# Patient Record
Sex: Female | Born: 1960
Health system: Southern US, Community
[De-identification: ages and names within clinical notes are randomized; demographics above are authoritative.]

## PROBLEM LIST (undated history)

## (undated) DIAGNOSIS — E049 Nontoxic goiter, unspecified: Secondary | ICD-10-CM

## (undated) DIAGNOSIS — M1990 Unspecified osteoarthritis, unspecified site: Secondary | ICD-10-CM

## (undated) DIAGNOSIS — J449 Chronic obstructive pulmonary disease, unspecified: Secondary | ICD-10-CM

## (undated) DIAGNOSIS — G43909 Migraine, unspecified, not intractable, without status migrainosus: Secondary | ICD-10-CM

## (undated) HISTORY — PX: TUMOR REMOVAL: SHX12

---

## 1998-11-05 ENCOUNTER — Other Ambulatory Visit: Admission: RE | Admit: 1998-11-05 | Discharge: 1998-11-05 | Payer: Self-pay | Admitting: Obstetrics and Gynecology

## 1999-06-02 ENCOUNTER — Inpatient Hospital Stay (HOSPITAL_COMMUNITY): Admission: AD | Admit: 1999-06-02 | Discharge: 1999-06-05 | Payer: Self-pay | Admitting: Obstetrics and Gynecology

## 2001-04-05 ENCOUNTER — Inpatient Hospital Stay (HOSPITAL_COMMUNITY): Admission: RE | Admit: 2001-04-05 | Discharge: 2001-04-07 | Payer: Self-pay | Admitting: Obstetrics and Gynecology

## 2002-06-17 ENCOUNTER — Encounter: Payer: Self-pay | Admitting: Family Medicine

## 2002-06-17 ENCOUNTER — Encounter: Admission: RE | Admit: 2002-06-17 | Discharge: 2002-06-17 | Payer: Self-pay | Admitting: Family Medicine

## 2004-10-28 ENCOUNTER — Encounter: Admission: RE | Admit: 2004-10-28 | Discharge: 2004-10-28 | Payer: Self-pay | Admitting: Family Medicine

## 2005-02-15 ENCOUNTER — Other Ambulatory Visit: Admission: RE | Admit: 2005-02-15 | Discharge: 2005-02-15 | Payer: Self-pay | Admitting: Obstetrics and Gynecology

## 2005-07-05 ENCOUNTER — Encounter: Admission: RE | Admit: 2005-07-05 | Discharge: 2005-07-05 | Payer: Self-pay | Admitting: Family Medicine

## 2010-10-29 ENCOUNTER — Encounter: Payer: Self-pay | Admitting: Family Medicine

## 2010-10-30 ENCOUNTER — Encounter: Payer: Self-pay | Admitting: Family Medicine

## 2014-03-29 ENCOUNTER — Emergency Department (HOSPITAL_COMMUNITY)
Admission: EM | Admit: 2014-03-29 | Discharge: 2014-03-30 | Disposition: A | Discharge status: Other Acute Inpatient Hospital | Payer: Self-pay | Attending: Emergency Medicine | Admitting: Emergency Medicine

## 2014-03-29 ENCOUNTER — Observation Stay (HOSPITAL_COMMUNITY): Admission: AD | Admit: 2014-03-29 | Payer: Self-pay | Source: Intra-hospital | Admitting: Psychiatry

## 2014-03-29 ENCOUNTER — Encounter (HOSPITAL_COMMUNITY): Payer: Self-pay | Admitting: Emergency Medicine

## 2014-03-29 DIAGNOSIS — F329 Major depressive disorder, single episode, unspecified: Secondary | ICD-10-CM | POA: Insufficient documentation

## 2014-03-29 DIAGNOSIS — Z79899 Other long term (current) drug therapy: Secondary | ICD-10-CM | POA: Insufficient documentation

## 2014-03-29 DIAGNOSIS — F3289 Other specified depressive episodes: Secondary | ICD-10-CM | POA: Insufficient documentation

## 2014-03-29 DIAGNOSIS — F17200 Nicotine dependence, unspecified, uncomplicated: Secondary | ICD-10-CM | POA: Insufficient documentation

## 2014-03-29 DIAGNOSIS — F32A Depression, unspecified: Secondary | ICD-10-CM

## 2014-03-29 DIAGNOSIS — F1010 Alcohol abuse, uncomplicated: Secondary | ICD-10-CM | POA: Insufficient documentation

## 2014-03-29 LAB — PREGNANCY, URINE: PREG TEST UR: NEGATIVE

## 2014-03-29 LAB — CBC WITH DIFFERENTIAL/PLATELET
BASOS PCT: 0 % (ref 0–1)
Basophils Absolute: 0 10*3/uL (ref 0.0–0.1)
EOS ABS: 0.1 10*3/uL (ref 0.0–0.7)
Eosinophils Relative: 2 % (ref 0–5)
HEMATOCRIT: 40.9 % (ref 36.0–46.0)
Hemoglobin: 13.4 g/dL (ref 12.0–15.0)
LYMPHS ABS: 2.5 10*3/uL (ref 0.7–4.0)
Lymphocytes Relative: 37 % (ref 12–46)
MCH: 29.9 pg (ref 26.0–34.0)
MCHC: 32.8 g/dL (ref 30.0–36.0)
MCV: 91.3 fL (ref 78.0–100.0)
MONOS PCT: 6 % (ref 3–12)
Monocytes Absolute: 0.4 10*3/uL (ref 0.1–1.0)
NEUTROS ABS: 3.7 10*3/uL (ref 1.7–7.7)
NEUTROS PCT: 55 % (ref 43–77)
Platelets: 249 10*3/uL (ref 150–400)
RBC: 4.48 MIL/uL (ref 3.87–5.11)
RDW: 13.3 % (ref 11.5–15.5)
WBC: 6.7 10*3/uL (ref 4.0–10.5)

## 2014-03-29 LAB — URINALYSIS, ROUTINE W REFLEX MICROSCOPIC
BILIRUBIN URINE: NEGATIVE
Glucose, UA: NEGATIVE mg/dL
KETONES UR: NEGATIVE mg/dL
LEUKOCYTES UA: NEGATIVE
Nitrite: NEGATIVE
PROTEIN: NEGATIVE mg/dL
Specific Gravity, Urine: 1.009 (ref 1.005–1.030)
Urobilinogen, UA: 0.2 mg/dL (ref 0.0–1.0)
pH: 6 (ref 5.0–8.0)

## 2014-03-29 LAB — BASIC METABOLIC PANEL
BUN: 10 mg/dL (ref 6–23)
CALCIUM: 9.3 mg/dL (ref 8.4–10.5)
CHLORIDE: 103 meq/L (ref 96–112)
CO2: 24 meq/L (ref 19–32)
Creatinine, Ser: 0.59 mg/dL (ref 0.50–1.10)
GFR calc Af Amer: >90 mL/min (ref >90)
GFR calc non Af Amer: >90 mL/min (ref >90)
Glucose, Bld: 93 mg/dL (ref 70–99)
Potassium: 3.4 mEq/L — ABNORMAL LOW (ref 3.7–5.3)
Sodium: 145 mEq/L (ref 137–147)

## 2014-03-29 LAB — URINE MICROSCOPIC-ADD ON

## 2014-03-29 LAB — TROPONIN I
Troponin I: <0.3 ng/mL (ref <0.30)
Troponin I: <0.3 ng/mL (ref <0.30)

## 2014-03-29 LAB — ACETAMINOPHEN LEVEL

## 2014-03-29 LAB — ETHANOL: Alcohol, Ethyl (B): 253 mg/dL — ABNORMAL HIGH (ref 0–11)

## 2014-03-29 LAB — SALICYLATE LEVEL: Salicylate Lvl: 23.3 mg/dL — ABNORMAL HIGH (ref 2.8–20.0)

## 2014-03-29 LAB — POTASSIUM: Potassium: 4.1 mEq/L (ref 3.7–5.3)

## 2014-03-29 MED ORDER — CLONAZEPAM 0.5 MG PO TABS
0.5000 mg | ORAL_TABLET | Freq: Two times a day (BID) | ORAL | Status: DC
  Administered 2014-03-29: 0.5 mg via ORAL
  Filled 2014-03-29: qty 1

## 2014-03-29 MED ORDER — ACETAMINOPHEN 325 MG PO TABS: 650.0000 mg | ORAL_TABLET | Freq: Once | ORAL | Status: DC

## 2014-03-29 MED ORDER — ONDANSETRON 4 MG PO TBDP
4.0000 mg | ORAL_TABLET | Freq: Once | ORAL | Status: AC
  Administered 2014-03-29: 4 mg via ORAL
  Filled 2014-03-29: qty 1

## 2014-03-29 MED ORDER — CITALOPRAM HYDROBROMIDE 10 MG PO TABS
40.0000 mg | ORAL_TABLET | Freq: Every day | ORAL | Status: DC
  Administered 2014-03-29: 40 mg via ORAL
  Filled 2014-03-29: qty 4

## 2014-03-29 MED ORDER — POTASSIUM CHLORIDE CRYS ER 20 MEQ PO TBCR
40.0000 meq | EXTENDED_RELEASE_TABLET | Freq: Once | ORAL | Status: AC
  Administered 2014-03-29: 40 meq via ORAL
  Filled 2014-03-29: qty 2

## 2014-03-29 MED ORDER — IBUPROFEN 800 MG PO TABS
800.0000 mg | ORAL_TABLET | Freq: Once | ORAL | Status: AC
  Administered 2014-03-29: 800 mg via ORAL
  Filled 2014-03-29: qty 1

## 2014-03-29 MED ORDER — LORAZEPAM 1 MG PO TABS
0.0000 mg | ORAL_TABLET | Freq: Four times a day (QID) | ORAL | Status: DC
  Administered 2014-03-29: 1 mg via ORAL
  Administered 2014-03-29: 2 mg via ORAL
  Administered 2014-03-29: 1 mg via ORAL
  Filled 2014-03-29 (×2): qty 1
  Filled 2014-03-29: qty 2

## 2014-03-29 MED ORDER — VITAMIN B-1 100 MG PO TABS
100.0000 mg | ORAL_TABLET | Freq: Every day | ORAL | Status: DC
Start: 2014-03-29 — End: 2014-03-30
  Administered 2014-03-29: 100 mg via ORAL
  Filled 2014-03-29: qty 1

## 2014-03-29 MED ORDER — THIAMINE HCL 100 MG/ML IJ SOLN: 100.0000 mg | Freq: Every day | INTRAMUSCULAR | Status: DC

## 2014-03-29 MED ORDER — LORAZEPAM 1 MG PO TABS
1.0000 mg | ORAL_TABLET | Freq: Once | ORAL | Status: AC
  Administered 2014-03-29: 1 mg via ORAL
  Filled 2014-03-29: qty 1

## 2014-03-29 MED ORDER — ACETAMINOPHEN 325 MG PO TABS
650.0000 mg | ORAL_TABLET | ORAL | Status: DC | PRN
  Administered 2014-03-29: 650 mg via ORAL
  Filled 2014-03-29: qty 2

## 2014-03-29 MED ORDER — MIRTAZAPINE 15 MG PO TABS
15.0000 mg | ORAL_TABLET | Freq: Every day | ORAL | Status: DC
  Administered 2014-03-29: 15 mg via ORAL
  Filled 2014-03-29: qty 1

## 2014-03-29 MED ORDER — LORAZEPAM 1 MG PO TABS: 0.0000 mg | ORAL_TABLET | Freq: Two times a day (BID) | ORAL | Status: DC

## 2014-03-29 MED ORDER — ONDANSETRON 4 MG PO TBDP
8.0000 mg | ORAL_TABLET | Freq: Once | ORAL | Status: AC
  Administered 2014-03-29: 8 mg via ORAL
  Filled 2014-03-29: qty 2

## 2014-03-29 NOTE — ED Notes (Signed)
Pt appears to be resting, sprawled on bed.  Asking for klonopin to be given now, not to wait until 2200.

## 2014-03-29 NOTE — Progress Notes (Signed)
Weekend CSW met with patient to provide support. Patient states that she is interested in detox services at this time. She currently participates in a 12-step program and receives mental health services through [facility] in [place]. Patient states that her children are currently being cared for by a family member during her hospitalization. She denies current SI/HI.  Tilden Fossa, MSW, [place] Clinical Social Worker [facility] Emergency Dept. [phone]

## 2014-03-29 NOTE — ED Notes (Signed)
Breakfast tray ordered for pt. Regular diet, no sharps.
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
## 2014-03-29 NOTE — ED Notes (Signed)
Telepsych in progress. 
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
## 2014-03-29 NOTE — BH Assessment (Addendum)
[facility] Assessment Progress Note  The following facilities were called in an attempt to place the pt:   [facility] - left message and faxed referral for review @ [address]1027  [facility] - no answer @ 1030  [facility] - low acuity per Dannielle Huhanny - faxed referral for review [facility] - fax referral per Suzie @ [phone] - referral faxed for review  [facility] - no beds per [facility] @ [address] [place] - [facility] beds [facility] @ 1034  [place] [facility] - [facility] beds [facility] @ 1035  [place] - no beds per [facility] @ 1035  [facility] - faxed referral for review [facility] - called and no answer @ 1212 [facility] - referral faxed for review  TTS will continue to seek placement for the pt.   Casimer LaniusKristen Butler, MS, [facility]  Licensed Professional Counselor  Triage Specialist

## 2014-03-29 NOTE — BH Assessment (Signed)
Assessment complete. Per Binnie RailJoann Glover, [facility], adult unit is at capacity. Consulted with Maryjean Mornharles Kober, PA-C who says Pt meets criteria for inpatient dual-diagnosis treatment. TTS will contact other facilities for treatment. Notified Dr. Jodi MourningZavitz of recommendation.  Harlin RainFord Ellis Ria CommentWarrick Jr, LPC, [facility] Triage Specialist [phone]

## 2014-03-29 NOTE — BH Assessment (Signed)
Received call for assessment. Spoke with Dr. Jodi MourningZavitz who said Pt relapsed on alcohol and reports feeling depressed. She is requesting treatment. Tele-assessment will be initiated.  Harlin RainFord Ellis Ria CommentWarrick Jr, LPC, [facility] Triage Specialist [phone]

## 2014-03-29 NOTE — ED Notes (Signed)
The pts family is at her bedside and they reported that she cut her wrists 2 weeks ago.  She was seen at [place] [facility] and [facility]

## 2014-03-29 NOTE — ED Notes (Signed)
Pt requesting something for her anxiety.
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
## 2014-03-29 NOTE — ED Notes (Signed)
House coverage notified of need for sitter 

## 2014-03-29 NOTE — ED Notes (Signed)
The pt has been drinking heavily since Monday when she returned [place] .  She had been there for 2 weeks detoxing from alcohol.  Her last alcohol was  2 houors ago lmp 6 months

## 2014-03-29 NOTE — ED Notes (Signed)
Pt would not like her family at bedside. This RN has asked them to wait in the lobby.

## 2014-03-29 NOTE — Progress Notes (Signed)
MHT attempted to call [facility] Pod D no answer.  Will attempt to contact the RN again.  Blain PaisMichelle L Wilkinson, MHT/NS

## 2014-03-29 NOTE — ED Notes (Signed)
Tele psych monitor at bedside 

## 2014-03-30 MED ORDER — HYDROXYZINE HCL 25 MG PO TABS
25.0000 mg | ORAL_TABLET | Freq: Once | ORAL | Status: AC
  Administered 2014-03-30: 25 mg via ORAL
  Filled 2014-03-30: qty 1

## 2014-03-30 NOTE — ED Notes (Signed)
Pt. Requesting something for her nerves. Informed her that she did not get ativan at 2400 because she did not meet the criteria for the alcohol withdrawal protocol. She is demanding something because she is about to leave. Will discuss with MD.

## 2014-05-08 ENCOUNTER — Emergency Department (HOSPITAL_COMMUNITY): Payer: Self-pay

## 2014-05-08 ENCOUNTER — Encounter (HOSPITAL_COMMUNITY): Payer: Self-pay | Admitting: Emergency Medicine

## 2014-05-08 ENCOUNTER — Emergency Department (HOSPITAL_COMMUNITY)
Admission: EM | Admit: 2014-05-08 | Discharge: 2014-05-08 | Disposition: A | Discharge status: Home / Self Care | Payer: Self-pay | Attending: Emergency Medicine | Admitting: Emergency Medicine

## 2014-05-08 DIAGNOSIS — Z79899 Other long term (current) drug therapy: Secondary | ICD-10-CM | POA: Insufficient documentation

## 2014-05-08 DIAGNOSIS — Y929 Unspecified place or not applicable: Secondary | ICD-10-CM | POA: Insufficient documentation

## 2014-05-08 DIAGNOSIS — F17200 Nicotine dependence, unspecified, uncomplicated: Secondary | ICD-10-CM | POA: Insufficient documentation

## 2014-05-08 DIAGNOSIS — X500XXA Overexertion from strenuous movement or load, initial encounter: Secondary | ICD-10-CM | POA: Insufficient documentation

## 2014-05-08 DIAGNOSIS — Y9389 Activity, other specified: Secondary | ICD-10-CM | POA: Insufficient documentation

## 2014-05-08 DIAGNOSIS — S8990XA Unspecified injury of unspecified lower leg, initial encounter: Secondary | ICD-10-CM | POA: Insufficient documentation

## 2014-05-08 DIAGNOSIS — S92109A Unspecified fracture of unspecified talus, initial encounter for closed fracture: Secondary | ICD-10-CM | POA: Insufficient documentation

## 2014-05-08 DIAGNOSIS — S99919A Unspecified injury of unspecified ankle, initial encounter: Secondary | ICD-10-CM

## 2014-05-08 DIAGNOSIS — S99929A Unspecified injury of unspecified foot, initial encounter: Secondary | ICD-10-CM

## 2014-05-08 DIAGNOSIS — S92102A Unspecified fracture of left talus, initial encounter for closed fracture: Secondary | ICD-10-CM

## 2014-05-08 MED ORDER — OXYCODONE-ACETAMINOPHEN 5-325 MG PO TABS: 1.0000 | ORAL_TABLET | Freq: Four times a day (QID) | ORAL | Status: DC | PRN

## 2014-05-08 MED ORDER — OXYCODONE-ACETAMINOPHEN 5-325 MG PO TABS
2.0000 | ORAL_TABLET | Freq: Once | ORAL | Status: AC
  Administered 2014-05-08: 2 via ORAL
  Filled 2014-05-08: qty 2

## 2014-05-08 NOTE — ED Provider Notes (Signed)
CSN: [id number]     Arrival date & time 05/08/14  2300 History  This chart was scribed for non-physician practitioner, Earley FavorGail Jacy Brocker, FNP, working with Raeford RazorStephen Kohut, MD, by Karle PlumberJennifer Tensley, ED Scribe.  This patient was seen in room WTR6/WTR6 and the patient's care was started at 11:04 PM.  Chief Complaint  Patient presents with  . Foot Injury   The history is provided by the patient. No language interpreter was used.   HPI Comments:  Tami Butler is a 53 y.o. female who presents to the Emergency Department complaining of severe left ankle pain that started approximately one hour ago secondary to stepping off her porch and twisting it the wrong way. She states she has not taken anything for pain. She denies numbness or tingling in the foot. She denies left hip or knee pain.  History reviewed. No pertinent past medical history. History reviewed. No pertinent past surgical history. History reviewed. No pertinent family history. History  Substance Use Topics  . Smoking status: Current Every Day Smoker  . Smokeless tobacco: Not on file  . Alcohol Use: Yes   OB History   Grav Para Term Preterm Abortions TAB SAB Ect Mult Living                 Review of Systems  Musculoskeletal: Positive for arthralgias.  Skin: Negative for wound.  Neurological: Negative for numbness.  All other systems reviewed and are negative.   Allergies  Review of patient's allergies indicates no known allergies.  Home Medications   Prior to Admission medications   Medication Sig Start Date End Date Taking? Authorizing Provider  citalopram (CELEXA) 40 MG tablet Take 40 mg by mouth at bedtime.   Yes Historical Provider, MD  mirtazapine (REMERON) 15 MG tablet Take 15 mg by mouth at bedtime.   Yes Historical Provider, MD  oxyCODONE-acetaminophen (PERCOCET/ROXICET) 5-325 MG per tablet Take 1 tablet by mouth every 6 (six) hours as needed for severe pain. 05/08/14   Arman FilterGail K Burnell Matlin, NP   Triage Vitals: BP 96/61   Pulse 96  Temp(Src) 98.7 F (37.1 C) (Oral)  Resp 20  Ht 5' (1.524 m)  Wt 125 lb (56.7 kg)  BMI 24.41 kg/m2  SpO2 97% Physical Exam  Nursing note and vitals reviewed. Constitutional: She is oriented to person, place, and time. She appears well-developed and well-nourished.  HENT:  Head: Normocephalic and atraumatic.  Eyes: EOM are normal.  Neck: Normal range of motion.  Cardiovascular: Normal rate.   Pulmonary/Chest: Effort normal.  Musculoskeletal: She exhibits tenderness.       Left foot: She exhibits decreased range of motion, tenderness and swelling. She exhibits no deformity.       Feet:  Neurological: She is alert and oriented to person, place, and time.  Skin: Skin is warm and dry.  Psychiatric: She has a normal mood and affect. Her behavior is normal.    ED Course  Procedures (including critical care time) DIAGNOSTIC STUDIES: Oxygen Saturation is 97% on RA, normal by my interpretation.   COORDINATION OF CARE: 11:11 PM- Will X-Ray right ankle and order pain medication. Pt verbalizes understanding and agrees to plan.  Medications  oxyCODONE-acetaminophen (PERCOCET/ROXICET) 5-325 MG per tablet 2 tablet (2 tablets Oral Given 05/08/14 2323)    Labs Review Labs Reviewed - No data to display  Imaging Review Dg Foot Complete Left  05/08/2014   CLINICAL DATA:  Pain and swelling along the lateral aspect left foot after a fall.  EXAM:  LEFT FOOT - COMPLETE 3+ VIEW  COMPARISON:  None.  FINDINGS: Small displaced bone fragments suggested along the distal anterior aspect of the talus and the distal lateral aspect of the calcaneus with soft tissue swelling. This likely represents an avulsion fracture. Left foot is otherwise unremarkable. No displaced fractures are otherwise identified.  IMPRESSION: Avulsion fractures demonstrated of the distal anterior talus and the distal lateral calcaneus.   Electronically Signed   By: Burman Nieves M.D.   On: 05/08/2014 23:24     EKG  Interpretation None      MDM  Pateint with small avulsion Fx placed in ACE, post op shoe and crutches  Final diagnoses:  Talar fracture, left, closed, initial encounter       I personally performed the services described in this documentation, which was scribed in my presence. The recorded information has been reviewed and is accurate.    Arman Filter, NP 05/08/14 2342

## 2014-05-08 NOTE — Discharge Instructions (Signed)
You have a very small avulsion fracture in your foot  It has been wrapped in and ACE bandage and placed in a post operative shoe for immobilization Please make an appointment with the orthopedist for further monitoring of the healing

## 2014-05-08 NOTE — ED Notes (Signed)
Pt fell in a hole and twisted her foot, left foot is swollen

## 2014-05-11 NOTE — ED Provider Notes (Signed)
Medical screening examination/treatment/procedure(s) were performed by non-physician practitioner and as supervising physician I was immediately available for consultation/collaboration.   EKG Interpretation None       [place] Welcome, MD 05/11/14 1134 

## 2014-12-01 ENCOUNTER — Emergency Department (HOSPITAL_COMMUNITY): Payer: Self-pay

## 2014-12-01 ENCOUNTER — Encounter (HOSPITAL_COMMUNITY): Payer: Self-pay | Admitting: Emergency Medicine

## 2014-12-01 ENCOUNTER — Emergency Department (HOSPITAL_COMMUNITY)
Admission: EM | Admit: 2014-12-01 | Discharge: 2014-12-01 | Disposition: A | Discharge status: Home / Self Care | Payer: Self-pay | Attending: Emergency Medicine | Admitting: Emergency Medicine

## 2014-12-01 DIAGNOSIS — S299XXA Unspecified injury of thorax, initial encounter: Secondary | ICD-10-CM | POA: Insufficient documentation

## 2014-12-01 DIAGNOSIS — M546 Pain in thoracic spine: Secondary | ICD-10-CM

## 2014-12-01 DIAGNOSIS — Z720 Tobacco use: Secondary | ICD-10-CM | POA: Insufficient documentation

## 2014-12-01 DIAGNOSIS — W1839XA Other fall on same level, initial encounter: Secondary | ICD-10-CM | POA: Insufficient documentation

## 2014-12-01 DIAGNOSIS — Y9289 Other specified places as the place of occurrence of the external cause: Secondary | ICD-10-CM | POA: Insufficient documentation

## 2014-12-01 DIAGNOSIS — Y998 Other external cause status: Secondary | ICD-10-CM | POA: Insufficient documentation

## 2014-12-01 DIAGNOSIS — Y9389 Activity, other specified: Secondary | ICD-10-CM | POA: Insufficient documentation

## 2014-12-01 MED ORDER — OXYCODONE-ACETAMINOPHEN 5-325 MG PO TABS: 1.0000 | ORAL_TABLET | ORAL | Status: DC | PRN

## 2014-12-01 MED ORDER — IBUPROFEN 800 MG PO TABS: 800.0000 mg | ORAL_TABLET | Freq: Three times a day (TID) | ORAL | Status: DC

## 2014-12-01 MED ORDER — OXYCODONE-ACETAMINOPHEN 5-325 MG PO TABS
1.0000 | ORAL_TABLET | Freq: Once | ORAL | Status: AC
  Administered 2014-12-01: 1 via ORAL
  Filled 2014-12-01: qty 1

## 2014-12-01 NOTE — ED Provider Notes (Signed)
CSN: [id number]     Arrival date & time 12/01/14  2141 History  This chart was scribed for Elpidio AnisShari Leva Baine, PA-C working with Enid SkeensJoshua M Zavitz, MD by Evon Slackerrance Branch, ED Scribe. This patient was seen in room WTR5/WTR5 and the patient's care was started at 10:49 PM.    Chief Complaint  Patient presents with  . Fall  . Back Pain   The history is provided by the patient. No language interpreter was used.   HPI Comments: Tami Butler is a 54 y.o. female who presents to the Emergency Department complaining of fall onset 1 night prioe. Pt states she is having right sided rib pain and back pain. Pt state that she stood up too fast on her hard wood floord and her feet came froup under her. Pt states that she landed on her back. Pt states that the pain is worse with deep breathing. Pt states that certain positions make her pain better. Pt doesn't report any other symptoms.    History reviewed. No pertinent past medical history. Past Surgical History  Procedure Laterality Date  . Cesarean section      x4   No family history on file. History  Substance Use Topics  . Smoking status: Current Every Day Smoker -- 0.50 packs/day    Types: Cigarettes  . Smokeless tobacco: Not on file  . Alcohol Use: No   OB History    No data available     Review of Systems  Musculoskeletal: Positive for back pain.  All other systems reviewed and are negative. A complete 10 system review of systems was obtained and all systems are negative except as noted in the HPI and PMH.      Allergies  Review of patient's allergies indicates no known allergies.  Home Medications   Prior to Admission medications   Medication Sig Start Date End Date Taking? Authorizing Provider  naproxen sodium (ANAPROX) 220 MG tablet Take 220 mg by mouth 2 (two) times daily as needed (pain).   Yes Historical Provider, MD  oxyCODONE-acetaminophen (PERCOCET/ROXICET) 5-325 MG per tablet Take 1 tablet by mouth every 6 (six) hours as  needed for severe pain. Patient not taking: Reported on 12/01/2014 05/08/14   Arman FilterGail K Schulz, NP   BP 131/86 mmHg  Pulse 93  Temp(Src) 97.8 F (36.6 C) (Oral)  Resp 18  SpO2 99%   Physical Exam  Constitutional: She is oriented to person, place, and time. She appears well-developed and well-nourished. No distress.  HENT:  Head: Normocephalic and atraumatic.  Eyes: Conjunctivae and EOM are normal.  Neck: Neck supple. No tracheal deviation present.  Cardiovascular: Normal rate.   Pulmonary/Chest: Effort normal and breath sounds normal. No respiratory distress. She exhibits no tenderness.  no anterior chest tenderness.   Abdominal: Soft. There is no tenderness. There is no CVA tenderness.  Musculoskeletal: Normal range of motion. She exhibits tenderness.  Tender over left upper thoracic back, no bruising or swelling noted.   Neurological: She is alert and oriented to person, place, and time.  Skin: Skin is warm and dry.  Psychiatric: She has a normal mood and affect. Her behavior is normal.  Nursing note and vitals reviewed.   ED Course  Procedures (including critical care time) DIAGNOSTIC STUDIES: Oxygen Saturation is 99% on RA, normal by my interpretation.    COORDINATION OF CARE: 11:01 PM-Discussed treatment plan with pt at bedside and pt agreed to plan.     Labs Review Labs Reviewed - No data to  display  Imaging Review Dg Ribs Unilateral W/chest Right  12/01/2014   CLINICAL DATA:  Patient slipped on hardwood floors at home. Mid right lower posterior rib pain.  EXAM: RIGHT RIBS AND CHEST - 3+ VIEW  COMPARISON:  Chest 04/15/2008  FINDINGS: Normal heart size and pulmonary vascularity. No focal airspace disease or consolidation in the lungs. No blunting of costophrenic angles. No pneumothorax. Mediastinal contours appear intact. Calcification of the aorta.  Right ribs appear intact. No acute displaced fractures or focal bone lesions appreciated.  IMPRESSION: Negative.    Electronically Signed   By: Burman Nieves M.D.   On: 12/01/2014 23:38     EKG Interpretation None      MDM   Final diagnoses:  None    1. Upper back pain  Negative imaging for rib fracture or lung abnormality. Suspect contusion injury from fall and will treat symptomatically.  I personally performed the services described in this documentation, which was scribed in my presence. The recorded information has been reviewed and is accurate.       Arnoldo Hooker, PA-C 12/07/14 2235  Enid Skeens, MD 12/12/14 [phone]

## 2014-12-01 NOTE — Discharge Instructions (Signed)
Heat Therapy °Heat therapy can help ease sore, stiff, injured, and tight muscles and joints. Heat relaxes your muscles, which may help ease your pain.  °RISKS AND COMPLICATIONS °If you have any of the following conditions, do not use heat therapy unless your health care provider has approved: °· Poor circulation. °· Healing wounds or scarred skin in the area being treated. °· Diabetes, heart disease, or high blood pressure. °· Not being able to feel (numbness) the area being treated. °· Unusual swelling of the area being treated. °· Active infections. °· Blood clots. °· Cancer. °· Inability to communicate pain. This may include young children and people who have problems with their brain function (dementia). °· Pregnancy. °Heat therapy should only be used on old, pre-existing, or long-lasting (chronic) injuries. Do not use heat therapy on new injuries unless directed by your health care provider. °HOW TO USE HEAT THERAPY °There are several different kinds of heat therapy, including: °· Moist heat pack. °· Warm water bath. °· Hot water bottle. °· Electric heating pad. °· Heated gel pack. °· Heated wrap. °· Electric heating pad. °Use the heat therapy method suggested by your health care provider. Follow your health care provider's instructions on when and how to use heat therapy. °GENERAL HEAT THERAPY RECOMMENDATIONS °· Do not sleep while using heat therapy. Only use heat therapy while you are awake. °· Your skin may turn pink while using heat therapy. Do not use heat therapy if your skin turns red. °· Do not use heat therapy if you have new pain. °· High heat or long exposure to heat can cause burns. Be careful when using heat therapy to avoid burning your skin. °· Do not use heat therapy on areas of your skin that are already irritated, such as with a rash or sunburn. °SEEK MEDICAL CARE IF: °· You have blisters, redness, swelling, or numbness. °· You have new pain. °· Your pain is worse. °MAKE SURE  YOU: °· Understand these instructions. °· Will watch your condition. °· Will get help right away if you are not doing well or get worse. °Document Released: 12/18/2011 Document Revised: 02/09/2014 Document Reviewed: 11/18/2013 °ExitCare® Patient Information ©2015 [employer]. This information is not intended to replace advice given to you by your health care provider. Make sure you discuss any questions you have with your health care provider. ° °

## 2014-12-01 NOTE — ED Notes (Signed)
Patient presents for fall last night, reports "muscle cramping" from right shoulder blade to right lower back. Denies numbness and tingling. Rates pain 8/10.

## 2014-12-06 ENCOUNTER — Emergency Department (HOSPITAL_COMMUNITY)
Admission: EM | Admit: 2014-12-06 | Discharge: 2014-12-07 | Disposition: A | Discharge status: Home / Self Care | Payer: Medicaid Other | Attending: Emergency Medicine | Admitting: Emergency Medicine

## 2014-12-06 ENCOUNTER — Encounter (HOSPITAL_COMMUNITY): Payer: Self-pay | Admitting: Emergency Medicine

## 2014-12-06 DIAGNOSIS — Z720 Tobacco use: Secondary | ICD-10-CM | POA: Insufficient documentation

## 2014-12-06 DIAGNOSIS — Z791 Long term (current) use of non-steroidal anti-inflammatories (NSAID): Secondary | ICD-10-CM | POA: Insufficient documentation

## 2014-12-06 DIAGNOSIS — M546 Pain in thoracic spine: Secondary | ICD-10-CM

## 2014-12-06 NOTE — ED Notes (Signed)
Pt fell one week ago and was evaluated and sent home w/ meds. Pt went back to work and ran out of meds and is now having more back pain. Alert and oriented.

## 2014-12-07 MED ORDER — OXYCODONE-ACETAMINOPHEN 5-325 MG PO TABS
1.0000 | ORAL_TABLET | Freq: Once | ORAL | Status: AC
  Administered 2014-12-07: 1 via ORAL
  Filled 2014-12-07: qty 1

## 2014-12-07 MED ORDER — CYCLOBENZAPRINE HCL 10 MG PO TABS: 10.0000 mg | ORAL_TABLET | Freq: Two times a day (BID) | ORAL | Status: DC | PRN

## 2014-12-07 MED ORDER — OXYCODONE-ACETAMINOPHEN 5-325 MG PO TABS: 1.0000 | ORAL_TABLET | ORAL | Status: DC | PRN

## 2014-12-07 NOTE — ED Provider Notes (Signed)
CSN: [id number]     Arrival date & time 12/06/14  2308 History   First MD Initiated Contact with Patient 12/07/14 0136     Chief Complaint  Patient presents with  . Back Pain     (Consider location/radiation/quality/duration/timing/severity/associated sxs/prior Treatment) Patient is a 54 y.o. female presenting with back pain. The history is provided by the patient. No language interpreter was used.  Back Pain Location:  Thoracic spine Quality:  Aching Radiates to:  Does not radiate Associated symptoms: no chest pain, no fever, no headaches and no numbness   Associated symptoms comment:  Patient was seen and evaluated after fall with injury to right upper back contusion on 2/23, x-rays negative at that time for rib fracture. She reports symptoms improved until she went back to work 2 days ago and pain started becoming more significant. No SOB, cough or fever. No flank or abdominal pain.   History reviewed. No pertinent past medical history. Past Surgical History  Procedure Laterality Date  . Cesarean section      x4   History reviewed. No pertinent family history. History  Substance Use Topics  . Smoking status: Current Every Day Smoker -- 0.50 packs/day    Types: Cigarettes  . Smokeless tobacco: Not on file  . Alcohol Use: No   OB History    No data available     Review of Systems  Constitutional: Negative for fever and chills.  Respiratory: Negative.  Negative for cough and shortness of breath.   Cardiovascular: Negative.  Negative for chest pain.  Gastrointestinal: Negative.  Negative for nausea.  Musculoskeletal: Positive for back pain.  Skin: Negative.   Neurological: Negative.  Negative for numbness and headaches.      Allergies  Review of patient's allergies indicates no known allergies.  Home Medications   Prior to Admission medications   Medication Sig Start Date End Date Taking? Authorizing Provider  ibuprofen (ADVIL,MOTRIN) 800 MG tablet Take 1 tablet  (800 mg total) by mouth 3 (three) times daily. 12/01/14  Yes Hassani Sliney A Alanny Rivers, PA-C  naproxen sodium (ANAPROX) 220 MG tablet Take 220 mg by mouth 2 (two) times daily as needed (pain).   Yes Historical Provider, MD  oxyCODONE-acetaminophen (PERCOCET/ROXICET) 5-325 MG per tablet Take 1-2 tablets by mouth every 4 (four) hours as needed for severe pain. Patient not taking: Reported on 12/06/2014 12/01/14   Melvenia Beam A Jinger Middlesworth, PA-C   BP 144/85 mmHg  Pulse 87  Temp(Src) 98 F (36.7 C) (Oral)  SpO2 98% Physical Exam  Constitutional: She is oriented to person, place, and time. She appears well-developed and well-nourished.  Neck: Normal range of motion.  Cardiovascular: Normal rate.   No murmur heard. Pulmonary/Chest: Effort normal. She has no wheezes. She has no rales.  Abdominal: There is no tenderness.  Musculoskeletal:  Right mid-thoracic lateral tenderness without swelling or bruising.   Neurological: She is alert and oriented to person, place, and time.  Skin: Skin is warm and dry.    ED Course  Procedures (including critical care time) Labs Review Labs Reviewed - No data to display  Imaging Review No results found.   EKG Interpretation None      MDM   Final diagnoses:  None    1. Thoracic back pain  Do not suspect pulmonary injury with clear lung sounds and no pulmonary complaints. Discussed re-imaging for rib fracture that may be more identifiable at this time and patient declined. Will refer to pcp for further management.  Arnoldo HookerShari A Joanmarie Tsang, PA-C 12/07/14 0235  Olivia Mackielga M Otter, MD 12/07/14 [phone]

## 2014-12-07 NOTE — Discharge Instructions (Signed)
Back Pain, Adult Low back pain is very common. About 1 in 5 people have back pain.The cause of low back pain is rarely dangerous. The pain often gets better over time.About half of people with a sudden onset of back pain feel better in just 2 weeks. About 8 in 10 people feel better by 6 weeks.  CAUSES Some common causes of back pain include:  Strain of the muscles or ligaments supporting the spine.  Wear and tear (degeneration) of the spinal discs.  Arthritis.  Direct injury to the back. DIAGNOSIS Most of the time, the direct cause of low back pain is not known.However, back pain can be treated effectively even when the exact cause of the pain is unknown.Answering your caregiver's questions about your overall health and symptoms is one of the most accurate ways to make sure the cause of your pain is not dangerous. If your caregiver needs more information, he or she may order lab work or imaging tests (X-rays or MRIs).However, even if imaging tests show changes in your back, this usually does not require surgery. HOME CARE INSTRUCTIONS For many people, back pain returns.Since low back pain is rarely dangerous, it is often a condition that people can learn to manageon their own.   Remain active. It is stressful on the back to sit or stand in one place. Do not sit, drive, or stand in one place for more than 30 minutes at a time. Take short walks on level surfaces as soon as pain allows.Try to increase the length of time you walk each day.  Do not stay in bed.Resting more than 1 or 2 days can delay your recovery.  Do not avoid exercise or work.Your body is made to move.It is not dangerous to be active, even though your back may hurt.Your back will likely heal faster if you return to being active before your pain is gone.  Pay attention to your body when you bend and lift. Many people have less discomfortwhen lifting if they bend their knees, keep the load close to their bodies,and  avoid twisting. Often, the most comfortable positions are those that put less stress on your recovering back.  Find a comfortable position to sleep. Use a firm mattress and lie on your side with your knees slightly bent. If you lie on your back, put a pillow under your knees.  Only take over-the-counter or prescription medicines as directed by your caregiver. Over-the-counter medicines to reduce pain and inflammation are often the most helpful.Your caregiver may prescribe muscle relaxant drugs.These medicines help dull your pain so you can more quickly return to your normal activities and healthy exercise.  Put ice on the injured area.  Put ice in a plastic bag.  Place a towel between your skin and the bag.  Leave the ice on for 15-20 minutes, 03-04 times a day for the first 2 to 3 days. After that, ice and heat may be alternated to reduce pain and spasms.  Ask your caregiver about trying back exercises and gentle massage. This may be of some benefit.  Avoid feeling anxious or stressed.Stress increases muscle tension and can worsen back pain.It is important to recognize when you are anxious or stressed and learn ways to manage it.Exercise is a great option. SEEK MEDICAL CARE IF:  You have pain that is not relieved with rest or medicine.  You have pain that does not improve in 1 week.  You have new symptoms.  You are generally not feeling well. SEEK   IMMEDIATE MEDICAL CARE IF:   You have pain that radiates from your back into your legs.  You develop new bowel or bladder control problems.  You have unusual weakness or numbness in your arms or legs.  You develop nausea or vomiting.  You develop abdominal pain.  You feel faint. Document Released: 09/25/2005 Document Revised: 03/26/2012 Document Reviewed: 01/27/2014 ExitCare Patient Information 2015 [employer]. This information is not intended to replace advice given to you by your health care provider. Make sure you  discuss any questions you have with your health care provider.  

## 2015-02-11 ENCOUNTER — Encounter (HOSPITAL_COMMUNITY): Payer: Self-pay | Admitting: Emergency Medicine

## 2015-02-11 ENCOUNTER — Emergency Department (HOSPITAL_COMMUNITY): Payer: Medicaid Other

## 2015-02-11 ENCOUNTER — Emergency Department (HOSPITAL_COMMUNITY)
Admission: EM | Admit: 2015-02-11 | Discharge: 2015-02-12 | Disposition: A | Discharge status: Home / Self Care | Payer: Medicaid Other | Attending: Emergency Medicine | Admitting: Emergency Medicine

## 2015-02-11 DIAGNOSIS — J40 Bronchitis, not specified as acute or chronic: Secondary | ICD-10-CM

## 2015-02-11 DIAGNOSIS — Z791 Long term (current) use of non-steroidal anti-inflammatories (NSAID): Secondary | ICD-10-CM | POA: Insufficient documentation

## 2015-02-11 DIAGNOSIS — Z8639 Personal history of other endocrine, nutritional and metabolic disease: Secondary | ICD-10-CM | POA: Insufficient documentation

## 2015-02-11 DIAGNOSIS — J209 Acute bronchitis, unspecified: Secondary | ICD-10-CM | POA: Insufficient documentation

## 2015-02-11 DIAGNOSIS — Z720 Tobacco use: Secondary | ICD-10-CM | POA: Insufficient documentation

## 2015-02-11 HISTORY — DX: Nontoxic goiter, unspecified: E04.9

## 2015-02-11 MED ORDER — BENZONATATE 100 MG PO CAPS
200.0000 mg | ORAL_CAPSULE | Freq: Three times a day (TID) | ORAL | Status: DC | PRN
  Administered 2015-02-12: 200 mg via ORAL
  Filled 2015-02-11: qty 2

## 2015-02-11 MED ORDER — HYDROCOD POLST-CPM POLST ER 10-8 MG/5ML PO SUER
5.0000 mL | Freq: Two times a day (BID) | ORAL | Status: DC | PRN
  Administered 2015-02-12: 5 mL via ORAL
  Filled 2015-02-11: qty 5

## 2015-02-11 MED ORDER — ALBUTEROL SULFATE HFA 108 (90 BASE) MCG/ACT IN AERS
1.0000 | INHALATION_SPRAY | RESPIRATORY_TRACT | Status: DC | PRN
Start: 2015-02-11 — End: 2015-02-12
  Administered 2015-02-12: 2 via RESPIRATORY_TRACT
  Filled 2015-02-11: qty 6.7

## 2015-02-11 NOTE — ED Notes (Signed)
Pt states she has been sick for several days now  Pt states she has cough,and her chest is sore from coughing  Pt sates she has used her family's nebulizer at home with some relief  Pt coughing in triage

## 2015-02-11 NOTE — ED Notes (Signed)
Patient ambulated to XR.

## 2015-02-12 MED ORDER — AZITHROMYCIN 250 MG PO TABS
500.0000 mg | ORAL_TABLET | Freq: Once | ORAL | Status: AC
  Administered 2015-02-12: 500 mg via ORAL
  Filled 2015-02-12: qty 2

## 2015-02-12 MED ORDER — BENZONATATE 200 MG PO CAPS: 200.0000 mg | ORAL_CAPSULE | Freq: Three times a day (TID) | ORAL | Status: DC | PRN

## 2015-02-12 MED ORDER — HYDROCOD POLST-CPM POLST ER 10-8 MG/5ML PO SUER: 5.0000 mL | Freq: Two times a day (BID) | ORAL | Status: DC | PRN

## 2015-02-12 MED ORDER — ALBUTEROL SULFATE HFA 108 (90 BASE) MCG/ACT IN AERS
1.0000 | INHALATION_SPRAY | RESPIRATORY_TRACT | Status: DC | PRN
Start: 2015-02-12 — End: 2016-08-01

## 2015-02-12 MED ORDER — AZITHROMYCIN 250 MG PO TABS
ORAL_TABLET | ORAL | Status: DC
Start: 2015-02-12 — End: 2015-02-21

## 2015-02-12 MED ORDER — IBUPROFEN 200 MG PO TABS
400.0000 mg | ORAL_TABLET | Freq: Once | ORAL | Status: AC
  Administered 2015-02-12: 400 mg via ORAL
  Filled 2015-02-12: qty 2

## 2015-02-12 NOTE — ED Notes (Signed)
Patient c/o cough for "a few days". Patient smokes @ 1ppd. Patient reports intermittent clear mucous production. Patient has taken Robitussin, Delsym, & Nyquil without significant relief.

## 2015-02-12 NOTE — ED Provider Notes (Signed)
CSN: [id number]     Arrival date & time 02/11/15  2308 History   First MD Initiated Contact with Patient 02/11/15 2332     Chief Complaint  Patient presents with  . Cough     (Consider location/radiation/quality/duration/timing/severity/associated sxs/prior Treatment) HPI 54 year old female presents to the emergency part with complaint of one week of worsening cough, wheeze, headache and low back pain.  Patient has taken over-the-counter medications to include Robitussin, Delsym and NyQuil without improvement.  Patient has been using her prior inhaler from pneumonia 2 months ago.  She has also been using a family members nebulizer with some improvement.  Patient smokes a pack a day.  Patient denies any fever.  Sputum is clear.  Patient does not have a primary care doctor.  No prior diagnosis of COPD, emphysema or asthma. Past Medical History  Diagnosis Date  . Goiter    Past Surgical History  Procedure Laterality Date  . Cesarean section      x4   Family History  Problem Relation Age of Onset  . Lymphoma Mother   . Dementia Father   . Cancer Other    History  Substance Use Topics  . Smoking status: Current Every Day Smoker -- 1.00 packs/day    Types: Cigarettes  . Smokeless tobacco: Not on file  . Alcohol Use: No   OB History    No data available     Review of Systems   See History of Present Illness; otherwise all other systems are reviewed and negative  Allergies  Review of patient's allergies indicates no known allergies.  Home Medications   Prior to Admission medications   Medication Sig Start Date End Date Taking? Authorizing Provider  cyclobenzaprine (FLEXERIL) 10 MG tablet Take 1 tablet (10 mg total) by mouth 2 (two) times daily as needed for muscle spasms. 12/07/14   Elpidio AnisShari Upstill, PA-C  ibuprofen (ADVIL,MOTRIN) 800 MG tablet Take 1 tablet (800 mg total) by mouth 3 (three) times daily. 12/01/14   Elpidio AnisShari Upstill, PA-C  naproxen sodium (ANAPROX) 220 MG tablet Take  220 mg by mouth 2 (two) times daily as needed (pain).    Historical Provider, MD  oxyCODONE-acetaminophen (PERCOCET/ROXICET) 5-325 MG per tablet Take 1-2 tablets by mouth every 4 (four) hours as needed for severe pain. 12/07/14   Shari Upstill, PA-C   BP 133/86 mmHg  Pulse 103  Temp(Src) 97.7 F (36.5 C) (Oral)  Resp 22  SpO2 97% Physical Exam  Constitutional: She is oriented to person, place, and time. She appears well-developed and well-nourished. She appears distressed (uncomfortable appearing).  HENT:  Head: Normocephalic and atraumatic.  Nose: Nose normal.  Mouth/Throat: Oropharynx is clear and moist.  Eyes: Conjunctivae and EOM are normal. Pupils are equal, round, and reactive to light.  Neck: Normal range of motion. Neck supple. No JVD present. No tracheal deviation present. No thyromegaly present.  Cardiovascular: Normal rate, regular rhythm, normal heart sounds and intact distal pulses.  Exam reveals no gallop and no friction rub.   No murmur heard. Pulmonary/Chest: Effort normal and breath sounds normal. No stridor. No respiratory distress. She has no wheezes. She has no rales. She exhibits no tenderness.  Frequent cough, no wheezing  Abdominal: Soft. Bowel sounds are normal. She exhibits no distension and no mass. There is no tenderness. There is no rebound and no guarding.  Musculoskeletal: Normal range of motion. She exhibits no edema or tenderness.  Lymphadenopathy:    She has no cervical adenopathy.  Neurological: She is  alert and oriented to person, place, and time. She displays normal reflexes. She exhibits normal muscle tone. Coordination normal.  Skin: Skin is warm and dry. No rash noted. No erythema. No pallor.  Psychiatric: She has a normal mood and affect. Her behavior is normal. Judgment and thought content normal.  Nursing note and vitals reviewed.   ED Course  Procedures (including critical care time) Labs Review Labs Reviewed - No data to display  Imaging  Review Dg Chest 2 View  02/12/2015   CLINICAL DATA:  Cough, 3 day duration.  Worsened tonight.  EXAM: CHEST  2 VIEW  COMPARISON:  12/01/2014  FINDINGS: The heart size and mediastinal contours are within normal limits. Both lungs are clear. The visualized skeletal structures are unremarkable.  IMPRESSION: No active cardiopulmonary disease.   Electronically Signed   By: Ellery Plunkaniel R Mitchell M.D.   On: 02/12/2015 00:29     EKG Interpretation None      MDM   Final diagnoses:  Bronchitis  Tobacco abuse    54 yo female with one week of cough.  Differential includes bronchitis, pneumonia, pertussis.  Will check cxr, provide some symptom relief.  Pt strongly encouraged to quit smoking.      Marisa Severinlga Michon Kaczmarek, MD 02/12/15 [phone]

## 2015-02-12 NOTE — Discharge Instructions (Signed)
The most important thing you can do for your overall health is stop smoking.  Use medications to help quiet her cough.  Hot tea with honey will help.  Use a humidifier.  Use your inhaler every 4-6 hours as needed for wheezing or cough.  Since you do not have a primary care provider, please use the list below to establish a local doctor.   Smoking Cessation Quitting smoking is important to your health and has many advantages. However, it is not always easy to quit since nicotine is a very addictive drug. Oftentimes, people try 3 times or more before being able to quit. This document explains the best ways for you to prepare to quit smoking. Quitting takes hard work and a lot of effort, but you can do it. ADVANTAGES OF QUITTING SMOKING  You will live longer, feel better, and live better.  Your body will feel the impact of quitting smoking almost immediately.  Within 20 minutes, blood pressure decreases. Your pulse returns to its normal level.  After 8 hours, carbon monoxide levels in the blood return to normal. Your oxygen level increases.  After 24 hours, the chance of having a heart attack starts to decrease. Your breath, hair, and body stop smelling like smoke.  After 48 hours, damaged nerve endings begin to recover. Your sense of taste and smell improve.  After 72 hours, the body is virtually free of nicotine. Your bronchial tubes relax and breathing becomes easier.  After 2 to 12 weeks, lungs can hold more air. Exercise becomes easier and circulation improves.  The risk of having a heart attack, stroke, cancer, or lung disease is greatly reduced.  After 1 year, the risk of coronary heart disease is cut in half.  After 5 years, the risk of stroke falls to the same as a nonsmoker.  After 10 years, the risk of lung cancer is cut in half and the risk of other cancers decreases significantly.  After 15 years, the risk of coronary heart disease drops, usually to the level of a  nonsmoker.  If you are pregnant, quitting smoking will improve your chances of having a healthy baby.  The people you live with, especially any children, will be healthier.  You will have extra money to spend on things other than cigarettes. QUESTIONS TO THINK ABOUT BEFORE ATTEMPTING TO QUIT You may want to talk about your answers with your health care provider.  Why do you want to quit?  If you tried to quit in the past, what helped and what did not?  What will be the most difficult situations for you after you quit? How will you plan to handle them?  Who can help you through the tough times? Your family? Friends? A health care provider?  What pleasures do you get from smoking? What ways can you still get pleasure if you quit? Here are some questions to ask your health care provider:  How can you help me to be successful at quitting?  What medicine do you think would be best for me and how should I take it?  What should I do if I need more help?  What is smoking withdrawal like? How can I get information on withdrawal? GET READY  Set a quit date.  Change your environment by getting rid of all cigarettes, ashtrays, matches, and lighters in your home, car, or work. Do not let people smoke in your home.  Review your past attempts to quit. Think about what worked and what  did not. GET SUPPORT AND ENCOURAGEMENT You have a better chance of being successful if you have help. You can get support in many ways.  Tell your family, friends, and coworkers that you are going to quit and need their support. Ask them not to smoke around you.  Get individual, group, or telephone counseling and support. Programs are available at [facility]. Call your local health department for information about programs in your area.  Spiritual beliefs and practices may help some smokers quit.  Download a "quit meter" on your computer to keep track of quit statistics, such as how  long you have gone without smoking, cigarettes not smoked, and money saved.  Get a self-help book about quitting smoking and staying off tobacco. LEARN NEW SKILLS AND BEHAVIORS  Distract yourself from urges to smoke. Talk to someone, go for a walk, or occupy your time with a task.  Change your normal routine. Take a different route to work. Drink tea instead of coffee. Eat breakfast in a different place.  Reduce your stress. Take a hot bath, exercise, or read a book.  Plan something enjoyable to do every day. Reward yourself for not smoking.  Explore interactive web-based programs that specialize in helping you quit. GET MEDICINE AND USE IT CORRECTLY Medicines can help you stop smoking and decrease the urge to smoke. Combining medicine with the above behavioral methods and support can greatly increase your chances of successfully quitting smoking.  Nicotine replacement therapy helps deliver nicotine to your body without the negative effects and risks of smoking. Nicotine replacement therapy includes nicotine gum, lozenges, inhalers, nasal sprays, and skin patches. Some may be available over-the-counter and others require a prescription.  Antidepressant medicine helps people abstain from smoking, but how this works is unknown. This medicine is available by prescription.  Nicotinic receptor partial agonist medicine simulates the effect of nicotine in your brain. This medicine is available by prescription. Ask your health care provider for advice about which medicines to use and how to use them based on your health history. Your health care provider will tell you what side effects to look out for if you choose to be on a medicine or therapy. Carefully read the information on the package. Do not use any other product containing nicotine while using a nicotine replacement product.  RELAPSE OR DIFFICULT SITUATIONS Most relapses occur within the first 3 months after quitting. Do not be discouraged  if you start smoking again. Remember, most people try several times before finally quitting. You may have symptoms of withdrawal because your body is used to nicotine. You may crave cigarettes, be irritable, feel very hungry, cough often, get headaches, or have difficulty concentrating. The withdrawal symptoms are only temporary. They are strongest when you first quit, but they will go away within 10-14 days. To reduce the chances of relapse, try to:  Avoid drinking alcohol. Drinking lowers your chances of successfully quitting.  Reduce the amount of caffeine you consume. Once you quit smoking, the amount of caffeine in your body increases and can give you symptoms, such as a rapid heartbeat, sweating, and anxiety.  Avoid smokers because they can make you want to smoke.  Do not let weight gain distract you. Many smokers will gain weight when they quit, usually less than 10 pounds. Eat a healthy diet and stay active. You can always lose the weight gained after you quit.  Find ways to improve your mood other than smoking. FOR MORE INFORMATION  www.smokefree.gov  Document Released: 09/19/2001 Document Revised: 02/09/2014 Document Reviewed: 01/04/2012 [facility] Patient Information 2015 [place], [place]. This information is not intended to replace advice given to you by your health care provider. Make sure you discuss any questions you have with your health care provider.  You Can Quit Smoking If you are ready to quit smoking or are thinking about it, congratulations! You have chosen to help yourself be healthier and live longer! There are lots of different ways to quit smoking. Nicotine gum, nicotine patches, a nicotine inhaler, or nicotine nasal spray can help with physical craving. Hypnosis, support groups, and medicines help break the habit of smoking. TIPS TO GET OFF AND STAY OFF CIGARETTES  Learn to predict your moods. Do not let a bad situation be your excuse to have a cigarette. Some situations  in your life might tempt you to have a cigarette.  Ask friends and co-workers not to smoke around you.  Make your home smoke-free.  Never have "just one" cigarette. It leads to wanting another and another. Remind yourself of your decision to quit.  On a card, make a list of your reasons for not smoking. Read it at least the same number of times a day as you have a cigarette. Tell yourself everyday, "I do not want to smoke. I choose not to smoke."  Ask someone at home or work to help you with your plan to quit smoking.  Have something planned after you eat or have a cup of coffee. Take a walk or get other exercise to perk you up. This will help to keep you from overeating.  Try a relaxation exercise to calm you down and decrease your stress. Remember, you may be tense and nervous the first two weeks after you quit. This will pass.  Find new activities to keep your hands busy. Play with a pen, coin, or rubber band. Doodle or draw things on paper.  Brush your teeth right after eating. This will help cut down the craving for the taste of tobacco after meals. You can try mouthwash too.  Try gum, breath mints, or diet candy to keep something in your mouth. IF YOU SMOKE AND WANT TO QUIT:  Do not stock up on cigarettes. Never buy a carton. Wait until one pack is finished before you buy another.  Never carry cigarettes with you at work or at home.  Keep cigarettes as far away from you as possible. Leave them with someone else.  Never carry matches or a lighter with you.  Ask yourself, "Do I need this cigarette or is this just a reflex?"  Bet with someone that you can quit. Put cigarette money in a piggy bank every morning. If you smoke, you give up the money. If you do not smoke, by the end of the week, you keep the money.  Keep trying. It takes 21 days to change a habit!  Talk to your doctor about using medicines to help you quit. These include nicotine replacement gum, lozenges, or skin  patches. Document Released: 07/22/2009 Document Revised: 12/18/2011 Document Reviewed: 07/22/2009 [facility] Patient Information 2015 [place], [place]. This information is not intended to replace advice given to you by your health care provider. Make sure you discuss any questions you have with your health care provider.

## 2015-02-21 ENCOUNTER — Emergency Department (HOSPITAL_COMMUNITY): Payer: Medicaid Other

## 2015-02-21 ENCOUNTER — Emergency Department (HOSPITAL_COMMUNITY)
Admission: EM | Admit: 2015-02-21 | Discharge: 2015-02-21 | Disposition: A | Discharge status: Home / Self Care | Payer: Medicaid Other | Attending: Emergency Medicine | Admitting: Emergency Medicine

## 2015-02-21 ENCOUNTER — Encounter (HOSPITAL_COMMUNITY): Payer: Self-pay | Admitting: Emergency Medicine

## 2015-02-21 DIAGNOSIS — R05 Cough: Secondary | ICD-10-CM

## 2015-02-21 DIAGNOSIS — Z8639 Personal history of other endocrine, nutritional and metabolic disease: Secondary | ICD-10-CM | POA: Insufficient documentation

## 2015-02-21 DIAGNOSIS — Z79899 Other long term (current) drug therapy: Secondary | ICD-10-CM | POA: Insufficient documentation

## 2015-02-21 DIAGNOSIS — R059 Cough, unspecified: Secondary | ICD-10-CM

## 2015-02-21 DIAGNOSIS — J441 Chronic obstructive pulmonary disease with (acute) exacerbation: Secondary | ICD-10-CM | POA: Insufficient documentation

## 2015-02-21 DIAGNOSIS — J40 Bronchitis, not specified as acute or chronic: Secondary | ICD-10-CM

## 2015-02-21 DIAGNOSIS — Z720 Tobacco use: Secondary | ICD-10-CM | POA: Insufficient documentation

## 2015-02-21 LAB — CBC WITH DIFFERENTIAL/PLATELET
Basophils Absolute: 0 10*3/uL (ref 0.0–0.1)
Basophils Relative: 0 % (ref 0–1)
EOS PCT: 2 % (ref 0–5)
Eosinophils Absolute: 0.1 10*3/uL (ref 0.0–0.7)
HEMATOCRIT: 40.9 % (ref 36.0–46.0)
HEMOGLOBIN: 13.1 g/dL (ref 12.0–15.0)
LYMPHS PCT: 29 % (ref 12–46)
Lymphs Abs: 1.4 10*3/uL (ref 0.7–4.0)
MCH: 29.3 pg (ref 26.0–34.0)
MCHC: 32 g/dL (ref 30.0–36.0)
MCV: 91.5 fL (ref 78.0–100.0)
MONOS PCT: 8 % (ref 3–12)
Monocytes Absolute: 0.4 10*3/uL (ref 0.1–1.0)
NEUTROS ABS: 2.9 10*3/uL (ref 1.7–7.7)
Neutrophils Relative %: 61 % (ref 43–77)
Platelets: 237 10*3/uL (ref 150–400)
RBC: 4.47 MIL/uL (ref 3.87–5.11)
RDW: 13 % (ref 11.5–15.5)
WBC: 4.7 10*3/uL (ref 4.0–10.5)

## 2015-02-21 LAB — I-STAT TROPONIN, ED: Troponin i, poc: 0 ng/mL (ref 0.00–0.08)

## 2015-02-21 LAB — BASIC METABOLIC PANEL
ANION GAP: 10 (ref 5–15)
BUN: 9 mg/dL (ref 6–20)
CALCIUM: 9.4 mg/dL (ref 8.9–10.3)
CO2: 24 mmol/L (ref 22–32)
Chloride: 104 mmol/L (ref 101–111)
Creatinine, Ser: 0.62 mg/dL (ref 0.44–1.00)
Glucose, Bld: 83 mg/dL (ref 65–99)
Potassium: 3.9 mmol/L (ref 3.5–5.1)
Sodium: 138 mmol/L (ref 135–145)

## 2015-02-21 MED ORDER — HYDROCOD POLST-CPM POLST ER 10-8 MG/5ML PO SUER: 5.0000 mL | Freq: Two times a day (BID) | ORAL | Status: DC | PRN

## 2015-02-21 MED ORDER — IPRATROPIUM-ALBUTEROL 0.5-2.5 (3) MG/3ML IN SOLN
3.0000 mL | Freq: Once | RESPIRATORY_TRACT | Status: AC
  Administered 2015-02-21: 3 mL via RESPIRATORY_TRACT
  Filled 2015-02-21: qty 3

## 2015-02-21 MED ORDER — METHYLPREDNISOLONE SODIUM SUCC 125 MG IJ SOLR
125.0000 mg | Freq: Once | INTRAMUSCULAR | Status: AC
  Administered 2015-02-21: 125 mg via INTRAVENOUS
  Filled 2015-02-21: qty 2

## 2015-02-21 MED ORDER — AZITHROMYCIN 250 MG PO TABS: ORAL_TABLET | ORAL | Status: DC

## 2015-02-21 MED ORDER — HYDROCOD POLST-CPM POLST ER 10-8 MG/5ML PO SUER
5.0000 mL | Freq: Once | ORAL | Status: AC
  Administered 2015-02-21: 5 mL via ORAL
  Filled 2015-02-21: qty 5

## 2015-02-21 MED ORDER — PREDNISONE 20 MG PO TABS: ORAL_TABLET | ORAL | Status: DC

## 2015-02-21 NOTE — Discharge Instructions (Signed)
Stop smoking.  Take zpack as prescribed.  Take prednisone as prescribed.  Use your albuterol pump every 4 hrs as needed.  Take tussionex for cough.   Follow up with your doctor.  Return to ER if you have worse cough, shortness of breath, fever.

## 2015-02-21 NOTE — ED Provider Notes (Signed)
CSN: [id number]     Arrival date & time 02/21/15  1457 History   First MD Initiated Contact with Patient 02/21/15 1514     Chief Complaint  Patient presents with  . Cough  . Shortness of Breath     (Consider location/radiation/quality/duration/timing/severity/associated sxs/prior Treatment) The history is provided by the patient.  Tami Butler is a 54 y.o. female smoker history of COPD here presenting with cough and shortness of breath. Cough and shortness of breath for the last 2 weeks or so. Some productive sputum and no fevers. Was seen in the ER 10 days ago was given Z-Pak, albuterol, Tussionex. States for the symptoms and improvement initially for the last several days got worse. Denies any nausea or vomiting or abdominal pain. She states that she has tried to cut down on smoking since her last visit. Has some chest pressure with coughing.    Past Medical History  Diagnosis Date  . Goiter    Past Surgical History  Procedure Laterality Date  . Cesarean section      x4   Family History  Problem Relation Age of Onset  . Lymphoma Mother   . Dementia Father   . Cancer Other    History  Substance Use Topics  . Smoking status: Current Every Day Smoker -- 1.00 packs/day    Types: Cigarettes  . Smokeless tobacco: Not on file  . Alcohol Use: No   OB History    No data available     Review of Systems  Respiratory: Positive for cough and shortness of breath.   All other systems reviewed and are negative.     Allergies  Review of patient's allergies indicates no known allergies.  Home Medications   Prior to Admission medications   Medication Sig Start Date End Date Taking? Authorizing Provider  albuterol (PROVENTIL HFA;VENTOLIN HFA) 108 (90 BASE) MCG/ACT inhaler Inhale 1-2 puffs into the lungs every 4 (four) hours as needed for wheezing or shortness of breath. 02/12/15  Yes Marisa Severinlga Otter, MD  Dextromethorphan-Guaifenesin 5-100 MG/5ML LIQD Take 10 mLs by mouth 2 (two)  times daily as needed (cough).   Yes Historical Provider, MD  guaifenesin (ROBITUSSIN) 100 MG/5ML syrup Take 200 mg by mouth 3 (three) times daily as needed for cough (cough).   Yes Historical Provider, MD  naproxen sodium (ANAPROX) 220 MG tablet Take 220 mg by mouth 2 (two) times daily as needed (pain).   Yes Historical Provider, MD  Pseudoeph-Doxylamine-DM-APAP (NYQUIL PO) Take 30 mLs by mouth at bedtime as needed (cough).   Yes Historical Provider, MD  azithromycin (ZITHROMAX) 250 MG tablet Take 1 tab po q day for 4 days Patient not taking: Reported on 02/21/2015 02/12/15   Marisa Severinlga Otter, MD  benzonatate (TESSALON) 200 MG capsule Take 1 capsule (200 mg total) by mouth 3 (three) times daily as needed for cough. Patient not taking: Reported on 02/21/2015 02/12/15   Marisa Severinlga Otter, MD  chlorpheniramine-HYDROcodone (TUSSIONEX) 10-8 MG/5ML SUER Take 5 mLs by mouth every 12 (twelve) hours as needed for cough. Patient not taking: Reported on 02/21/2015 02/12/15   Marisa Severinlga Otter, MD   BP 111/85 mmHg  Pulse 101  Temp(Src) 99.8 F (37.7 C) (Oral)  Resp 22  SpO2 98% Physical Exam  Constitutional: She is oriented to person, place, and time.  coughing  HENT:  Head: Normocephalic.  Mouth/Throat: Oropharynx is clear and moist.  Eyes: Conjunctivae are normal. Pupils are equal, round, and reactive to light.  Neck: Neck supple.  Cardiovascular:  Normal rate, regular rhythm and normal heart sounds.   Pulmonary/Chest:  Coughing, minimal wheezing   Abdominal: Soft. Bowel sounds are normal. She exhibits no distension. There is no tenderness. There is no rebound.  Musculoskeletal: Normal range of motion. She exhibits no edema or tenderness.  Neurological: She is alert and oriented to person, place, and time. No cranial nerve deficit. Coordination normal.  Skin: Skin is warm.  Psychiatric: She has a normal mood and affect. Her behavior is normal. Thought content normal.  Nursing note and vitals reviewed.   ED Course   Procedures (including critical care time) Labs Review Labs Reviewed  CBC WITH DIFFERENTIAL/PLATELET  BASIC METABOLIC PANEL  Tami Butler, ED    Imaging Review Dg Chest 2 View  02/21/2015   CLINICAL DATA:  Productive cough 2-3 days.  EXAM: CHEST  2 VIEW  COMPARISON:  02/11/2015 and 12/01/2014  FINDINGS: Lungs are adequately inflated without focal consolidation or effusion. Cardiomediastinal silhouette and remainder of the exam is unchanged.  IMPRESSION: No active cardiopulmonary disease.   Electronically Signed   By: Elberta Fortisaniel  Boyle M.D.   On: 02/21/2015 16:35     EKG Interpretation   Date/Time:  Sunday Feb 21 2015 15:46:45 EDT Ventricular Rate:  82 PR Interval:  115 QRS Duration: 86 QT Interval:  389 QTC Calculation: 454 R Axis:   75 Text Interpretation:  Sinus rhythm Borderline short PR interval No  significant change since last tracing Confirmed by Khasir Woodrome  MD, Deeann Servidio ([id number])  on 02/21/2015 4:13:56 PM      MDM   Final diagnoses:  Cough    Tami AlmasSherry F Stripling is a 54 y.o. female here with cough, shortness of breath. Likely COPD exacerbation. Will repeat cxr, get labs. Will give albuterol and reassess.   5:24 PM After 1 neb, still short of breath. Labs and CXR unremarkable. Give steroids and second neb and tussionex and felt better. Will dc home with same. Also give another course of zpack. I told her to stop smoking.    Richardean Canalavid H Johnanthony Wilden, MD 02/21/15 [id number]

## 2015-02-21 NOTE — ED Notes (Signed)
Pt states she was here for bronchitis 2 weeks ago and finished meds but hasn't gotten better. States she has SOB and cough that is making chest sore. Denies n/v/d but states ash no appetite.

## 2015-02-21 NOTE — ED Notes (Signed)
Pt ambulatory and a&ox4, questions concerns denied r/t dc

## 2015-02-21 NOTE — ED Notes (Signed)
EKG given to Dr. Yao.  

## 2015-03-31 ENCOUNTER — Emergency Department (HOSPITAL_COMMUNITY)
Admission: EM | Admit: 2015-03-31 | Discharge: 2015-03-31 | Disposition: A | Discharge status: Home / Self Care | Payer: Medicaid Other | Attending: Emergency Medicine | Admitting: Emergency Medicine

## 2015-03-31 ENCOUNTER — Encounter (HOSPITAL_COMMUNITY): Payer: Self-pay | Admitting: *Deleted

## 2015-03-31 DIAGNOSIS — Z79899 Other long term (current) drug therapy: Secondary | ICD-10-CM | POA: Insufficient documentation

## 2015-03-31 DIAGNOSIS — M5412 Radiculopathy, cervical region: Secondary | ICD-10-CM | POA: Insufficient documentation

## 2015-03-31 DIAGNOSIS — Z8639 Personal history of other endocrine, nutritional and metabolic disease: Secondary | ICD-10-CM | POA: Insufficient documentation

## 2015-03-31 DIAGNOSIS — M25511 Pain in right shoulder: Secondary | ICD-10-CM | POA: Insufficient documentation

## 2015-03-31 DIAGNOSIS — Z720 Tobacco use: Secondary | ICD-10-CM | POA: Insufficient documentation

## 2015-03-31 DIAGNOSIS — Z7982 Long term (current) use of aspirin: Secondary | ICD-10-CM | POA: Insufficient documentation

## 2015-03-31 DIAGNOSIS — M542 Cervicalgia: Secondary | ICD-10-CM

## 2015-03-31 DIAGNOSIS — M62838 Other muscle spasm: Secondary | ICD-10-CM | POA: Insufficient documentation

## 2015-03-31 DIAGNOSIS — Z87828 Personal history of other (healed) physical injury and trauma: Secondary | ICD-10-CM | POA: Insufficient documentation

## 2015-03-31 MED ORDER — METHOCARBAMOL 500 MG PO TABS
500.0000 mg | ORAL_TABLET | Freq: Once | ORAL | Status: AC
  Administered 2015-03-31: 500 mg via ORAL
  Filled 2015-03-31: qty 1

## 2015-03-31 MED ORDER — OXYCODONE-ACETAMINOPHEN 5-325 MG PO TABS
2.0000 | ORAL_TABLET | Freq: Once | ORAL | Status: AC
Start: 2015-03-31 — End: 2015-03-31
  Administered 2015-03-31: 2 via ORAL
  Filled 2015-03-31 (×2): qty 2

## 2015-03-31 MED ORDER — OXYCODONE-ACETAMINOPHEN 5-325 MG PO TABS: 1.0000 | ORAL_TABLET | Freq: Three times a day (TID) | ORAL | Status: DC | PRN

## 2015-03-31 MED ORDER — CYCLOBENZAPRINE HCL 10 MG PO TABS: 10.0000 mg | ORAL_TABLET | Freq: Two times a day (BID) | ORAL | Status: DC | PRN

## 2015-03-31 MED ORDER — MELOXICAM 15 MG PO TABS: 15.0000 mg | ORAL_TABLET | Freq: Every day | ORAL | Status: DC

## 2015-03-31 NOTE — ED Notes (Signed)
Pt in from home c/o neck pain onset x 1 wk with worsening pain, denies recent injury, pt ambulatory, pt pain radiating down R shoulder, A&O x4
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
## 2015-03-31 NOTE — ED Notes (Signed)
PA at bedside.

## 2015-03-31 NOTE — Discharge Instructions (Signed)
1. Medications: flexeril, Mobic or over the counter ibuprofen 800mg  3x/day with food (not both), percocet, usual home medications 2. Treatment: rest, drink plenty of fluids, gentle stretching as discussed, alternate ice and heat 3. Follow Up: Please followup with your primary doctor in 3 days for discussion of your diagnoses and further evaluation after today's visit; if you do not have a primary care doctor use the resource guide provided to find one;  Return to the ER for worsening back pain, difficulty walking, loss of bowel or bladder control or other concerning symptoms     Cervical Radiculopathy Cervical radiculopathy happens when a nerve in the neck is pinched or bruised by a slipped (herniated) disk or by arthritic changes in the bones of the cervical spine. This can occur due to an injury or as part of the normal aging process. Pressure on the cervical nerves can cause pain or numbness that runs from your neck all the way down into your arm and fingers. CAUSES  There are many possible causes, including:  Injury.  Muscle tightness in the neck from overuse.  Swollen, painful joints (arthritis).  Breakdown or degeneration in the bones and joints of the spine (spondylosis) due to aging.  Bone spurs that may develop near the cervical nerves. SYMPTOMS  Symptoms include pain, weakness, or numbness in the affected arm and hand. Pain can be severe or irritating. Symptoms may be worse when extending or turning the neck. DIAGNOSIS  Your caregiver will ask about your symptoms and do a physical exam. He or she may test your strength and reflexes. X-rays, CT scans, and MRI scans may be needed in cases of injury or if the symptoms do not go away after a period of time. Electromyography (EMG) or nerve conduction testing may be done to study how your nerves and muscles are working. TREATMENT  Your caregiver may recommend certain exercises to help relieve your symptoms. Cervical radiculopathy can,  and often does, get better with time and treatment. If your problems continue, treatment options may include:  Wearing a soft collar for short periods of time.  Physical therapy to strengthen the neck muscles.  Medicines, such as nonsteroidal anti-inflammatory drugs (NSAIDs), oral corticosteroids, or spinal injections.  Surgery. Different types of surgery may be done depending on the cause of your problems. HOME CARE INSTRUCTIONS   Put ice on the affected area.  Put ice in a plastic bag.  Place a towel between your skin and the bag.  Leave the ice on for 15-20 minutes, 03-04 times a day or as directed by your caregiver.  If ice does not help, you can try using heat. Take a warm shower or bath, or use a hot water bottle as directed by your caregiver.  You may try a gentle neck and shoulder massage.  Use a flat pillow when you sleep.  Only take over-the-counter or prescription medicines for pain, discomfort, or fever as directed by your caregiver.  If physical therapy was prescribed, follow your caregiver's directions.  If a soft collar was prescribed, use it as directed. SEEK IMMEDIATE MEDICAL CARE IF:   Your pain gets much worse and cannot be controlled with medicines.  You have weakness or numbness in your hand, arm, face, or leg.  You have a high fever or a stiff, rigid neck.  You lose bowel or bladder control (incontinence).  You have trouble with walking, balance, or speaking. MAKE SURE YOU:   Understand these instructions.  Will watch your condition.  Will  get help right away if you are not doing well or get worse. Document Released: 06/20/2001 Document Revised: 12/18/2011 Document Reviewed: 05/09/2011 [facility] Patient Information 2015 [place], [place]. This information is not intended to replace advice given to you by your health care provider. Make sure you discuss any questions you have with your health care provider.    Emergency Department Resource  Guide 1) Find a Doctor and Pay Out of Pocket Although you won't have to find out who is covered by your insurance plan, it is a good idea to ask around and get recommendations. You will then need to call the office and see if the doctor you have chosen will accept you as a new patient and what types of options they offer for patients who are self-pay. Some doctors offer discounts or will set up payment plans for their patients who do not have insurance, but you will need to ask so you aren't surprised when you get to your appointment.  2) Contact Your Local Health Department Not all health departments have doctors that can see patients for sick visits, but many do, so it is worth a call to see if yours does. If you don't know where your local health department is, you can check in your phone book. The CDC also has a tool to help you locate your state's health department, and many state websites also have listings of all of their local health departments.  3) Find a Walk-in Clinic If your illness is not likely to be very severe or complicated, you may want to try a walk in clinic. These are popping up all over the country in pharmacies, drugstores, and shopping centers. They're usually staffed by nurse practitioners or physician assistants that have been trained to treat common illnesses and complaints. They're usually fairly quick and inexpensive. However, if you have serious medical issues or chronic medical problems, these are probably not your best option.  No Primary Care Doctor: - Call Health Connect at  [phone] - they can help you locate a primary care doctor that  accepts your insurance, provides certain services, etc. - Physician Referral Service- [phone]  Chronic Pain Problems: Organization         Address  Phone   Notes  [facility]  [phone] Patients need to be referred by their primary care doctor.   Medication Assistance: Organization          Address  Phone   Notes  [facility] [address] [place], [place] [address] [phone] --Must be a resident of [facility] -- Must have NO insurance coverage whatsoever (no Medicaid/ Medicare, etc.) -- The pt. MUST have a primary care doctor that directs their care regularly and follows them in the community   [facility]  [phone]   [employer]  [phone]    Agencies that provide inexpensive medical care: Organization         Address  Phone   Notes  [facility]  [phone]   [facility]    [phone]   [facility] [address] [place], [place] [address] [phone]   [facility] [address] [place] [place] [phone]   Planned Parenthood    [phone]   [facility]    [phone]   [facility]  [address], [place] Phone:  [phone],  Fax:  [phone] Hours of Operation:  9 am - 6 pm, M-F.  Also accepts Medicaid/Medicare and self-pay.  [facility]  [address], [place] Phone: [phone], Fax: [phone]. Hours of Operation:  8:30 am - 5:30 pm, M-F.  Also accepts Medicaid and self-pay.  [facility] [place] [address], [place] Phone: [phone]   [facility] [address] Natasha Bence [place], [place] [phone], Ext. [phone] Mondays & Thursdays: 7-9 AM.  First 15 patients are seen on a first come, first serve basis.    Medicaid-accepting [facility] Providers:  Organization         Address  Phone   Notes  [facility] [address], [place] [phone] Also accepts self-pay patients.  [facility] [address] Laurell Josephs [place], [place]  [phone]   [facility] [address], [place] [phone]   [facility] [address], [place] [phone]   Renaye Rakers [address], [place]   [phone] Only accepts [place] patients after they have their name applied to their card.   Self-Pay (no insurance) in [facility]:  Organization         Address  Phone   Notes  Sickle Cell Patients, [facility] [address] [place], [place] [phone]   [facility] [address] [place], [place] [phone]   [facility] [place]  [address], [place] [phone]   [facility]/Dr. Osei-Bonsu  [address] [place] or [address], [place] [phone] Phone number for both [place] and [place] locations is the same.  [facility] [address] [place] [phone]   [facility] [address] [place] or [address] [phone]   [facility] [address], [place] [phone], phone; [phone], fax Sees patients 1st and 3rd Saturday of every month.  Must not qualify for public or private insurance (i.e. Medicaid, Medicare, [place] Health Choice, Veterans' Benefits)  Household income should be no more than 200% of the poverty level The clinic cannot treat you if you are pregnant or think you are pregnant  Sexually transmitted diseases are not treated at the clinic.    Dental Care: Organization         Address  Phone  Notes  [facility] Department of [facility] [address] [place], [place] [phone] Accepts children up to age 68 who are enrolled in [place] or [place] Health Choice; pregnant women with a Medicaid card; and children who have applied for Medicaid or [place] Health Choice, but were declined, whose parents can pay a reduced fee at time of service.  [facility] of [facility]  [address], [place] [phone] Accepts children up to age 29 who are enrolled in [place] or [place] Health Choice; pregnant women with a Medicaid card; and children who have applied for Medicaid or [place] Health Choice, but were declined, whose parents can pay a reduced fee at time of service.  [facility]  [address] [place], [place] [phone] Patients are seen by appointment only. Walk-ins are not  accepted. [facility] will see patients 10 years of age and older. Monday - Tuesday (8am-5pm) Most Wednesdays (8:30-5pm) $30 per visit, cash only  [facility] Adult Dental Access [facility]  [address], [facility] [phone] Patients are seen by appointment only. Walk-ins are not accepted. [facility] will see patients 54 years of age and older. One Wednesday Evening (Monthly: Volunteer Based).  $30 per visit, cash only  [employer]  [phone] for adults; Children under age 1, call Graduate Pediatric Dentistry at [phone]. Children aged 51-14, please call [phone] to request a pediatric application.  Dental services are provided in all areas of dental care including fillings, crowns and bridges, complete and partial dentures, implants, gum treatment, root canals, and extractions. Preventive care is also provided. Treatment is provided to both adults and children. Patients are selected via a lottery and there is often a waiting list.   [facility] [address] [place]  [phone] [email or web address]   [facility] [address] [place], [place] [phone], Ext. [phone] Second and Fourth Thursday of each month, opens at 6:30 AM; Clinic ends at 9 AM.  Patients are seen on a first-come first-served basis, and a limited number are seen during each clinic.   [facility]  [address] Ether Griffins [place], [place] [phone]   Eligibility Requirements You must  have lived in [place], [place], or [place] counties for at least the last three months.   You cannot be eligible for state or federal sponsored [place], including [employer], [place], or [employer].   You generally cannot be eligible for healthcare insurance through your employer.    How to apply: Eligibility screenings are held every Tuesday and Wednesday afternoon from 1:00 pm until 4:00 pm. You do not need an appointment for the interview!  [facility] [address] [employer] [place], [place] [phone]   [facility] Health Department  [phone]   [facility]  [phone]   [facility] Health Department  [phone]    Behavioral Health Resources in the Community: Intensive Outpatient Programs Organization         Address  Phone  Notes  [facility] [address], [place], [place] [phone]   [facility] [address] [place], [place] [phone]   [facility] [address], [place], [place]  [phone]   [facility] [address],  [place], [place] [phone] or [phone]   Substance Abuse Resources Organization         Address  Phone  Notes  [facility]  [phone]   [facility]  6398851595   [employer]  [phone]   Floydene Flock  [phone]   Residential & Outpatient Substance Abuse Program  [phone]   Psychological Services Organization         Address  Phone  Notes  [facility] Behavioral Health  [phone]   [facility]  [phone]   [facility] [address], [place] [phone] or [phone]    Mobile Crisis Teams Organization         Address  Phone  Notes  [facility], Mobile Crisis Care Unit  [phone]   [facility]  [address] [place], [place] [phone]   [facility] [address] [place] [phone]    Self-Help/Support Groups  Organization         Address  Phone             Notes  [facility] [place] - variety of support groups  336- I7437963 Call for more information  Narcotics Anonymous (NA), [facility] [address], [employer] [place]  2 meetings at this location   Statistician         Address  Phone  Notes  [facility] [address],    [place]  [phone]   [facility]  [address], [place] [address], [place], [place] [phone]   [facility] [address] [place], [place] [phone] Admissions: 8am-3pm M-F  [facility] [address]    [place], [place] [phone]   [facility] [address] [place], [place], [place] [phone]   [facility] [address],  [place], [place] [phone]   [facility] - Intensive Outpatient [address]urell Joseph[address], [place], [place] [phone]   [facility] ([facility] [address] [place].,  [place], [place] [phone] or [phone]   [facility] ([facility]) [address] [place], [place] [phone] Accepts Medicaid  [facility] [address],  [place] [phone] Substance Abuse/Addiction Treatment   [facility] Organization         Address  Phone  Notes  [facility]  [phone]   Angie Fava, PhD [address] Ervin Knack [place], [place]   [phone] or [phone]   [facility]   [address] [place], [place] [phone]   [facility] [address], [place], [place] [phone] Insurance/Medicaid/sponsorship through [facility] and Families [address]                                    [place], [place] [phone] Therapy/tele-psych/case  [facility] [address], [place] [phone]    Dr. Lolly Mustache  [phone]   [facility]  [employer] [facility] Dept. 1) [address] [place] 2) [address] [place] 3)  [address], [place] [phone]  [phone]   [facility] Child Abuse Hotline 531-822-3528 or [phone] (After Hours)

## 2015-04-25 ENCOUNTER — Emergency Department (HOSPITAL_COMMUNITY): Payer: Medicaid Other

## 2015-04-25 ENCOUNTER — Encounter (HOSPITAL_COMMUNITY): Payer: Self-pay | Admitting: Emergency Medicine

## 2015-04-25 ENCOUNTER — Emergency Department (HOSPITAL_COMMUNITY)
Admission: EM | Admit: 2015-04-25 | Discharge: 2015-04-26 | Disposition: A | Discharge status: Home / Self Care | Payer: Medicaid Other | Attending: Emergency Medicine | Admitting: Emergency Medicine

## 2015-04-25 DIAGNOSIS — R062 Wheezing: Secondary | ICD-10-CM

## 2015-04-25 DIAGNOSIS — Z8639 Personal history of other endocrine, nutritional and metabolic disease: Secondary | ICD-10-CM | POA: Insufficient documentation

## 2015-04-25 DIAGNOSIS — R0602 Shortness of breath: Secondary | ICD-10-CM | POA: Diagnosis present

## 2015-04-25 DIAGNOSIS — Z720 Tobacco use: Secondary | ICD-10-CM | POA: Diagnosis not present

## 2015-04-25 DIAGNOSIS — Z791 Long term (current) use of non-steroidal anti-inflammatories (NSAID): Secondary | ICD-10-CM | POA: Insufficient documentation

## 2015-04-25 DIAGNOSIS — J069 Acute upper respiratory infection, unspecified: Secondary | ICD-10-CM | POA: Diagnosis not present

## 2015-04-25 DIAGNOSIS — J45901 Unspecified asthma with (acute) exacerbation: Secondary | ICD-10-CM | POA: Diagnosis not present

## 2015-04-25 MED ORDER — PREDNISONE 20 MG PO TABS: 60.0000 mg | ORAL_TABLET | Freq: Every day | ORAL | Status: AC

## 2015-04-25 MED ORDER — ALBUTEROL SULFATE HFA 108 (90 BASE) MCG/ACT IN AERS
6.0000 | INHALATION_SPRAY | Freq: Once | RESPIRATORY_TRACT | Status: AC
  Administered 2015-04-26: 6 via RESPIRATORY_TRACT
  Filled 2015-04-25: qty 6.7

## 2015-04-25 MED ORDER — AEROCHAMBER PLUS W/MASK MISC
1.0000 | Freq: Once | Status: AC
  Administered 2015-04-26: 1
  Filled 2015-04-25: qty 1

## 2015-04-25 MED ORDER — IPRATROPIUM-ALBUTEROL 0.5-2.5 (3) MG/3ML IN SOLN
3.0000 mL | Freq: Once | RESPIRATORY_TRACT | Status: AC
  Administered 2015-04-25: 3 mL via RESPIRATORY_TRACT
  Filled 2015-04-25: qty 3

## 2015-04-25 MED ORDER — IPRATROPIUM-ALBUTEROL 0.5-2.5 (3) MG/3ML IN SOLN
RESPIRATORY_TRACT | Status: AC
  Administered 2015-04-25: 3 mL
  Filled 2015-04-25: qty 3

## 2015-04-25 MED ORDER — HYDROMORPHONE HCL 1 MG/ML IJ SOLN
1.0000 mg | Freq: Once | INTRAMUSCULAR | Status: AC
  Administered 2015-04-25: 1 mg via INTRAMUSCULAR
  Filled 2015-04-25: qty 1

## 2015-04-25 MED ORDER — IBUPROFEN 400 MG PO TABS
600.0000 mg | ORAL_TABLET | Freq: Once | ORAL | Status: AC
  Administered 2015-04-26: 600 mg via ORAL
  Filled 2015-04-25 (×2): qty 1

## 2015-04-25 MED ORDER — PREDNISONE 20 MG PO TABS
60.0000 mg | ORAL_TABLET | Freq: Once | ORAL | Status: AC
  Administered 2015-04-25: 60 mg via ORAL
  Filled 2015-04-25: qty 3

## 2015-04-25 NOTE — ED Notes (Signed)
Pt to Xray.

## 2015-04-25 NOTE — ED Notes (Signed)
Provider bedside.

## 2015-04-25 NOTE — ED Provider Notes (Signed)
CSN: [id number]     Arrival date & time 04/25/15  2200 History   First MD Initiated Contact with Patient 04/25/15 2217     Chief Complaint  Patient presents with  . Shortness of Breath    (Consider location/radiation/quality/duration/timing/severity/associated sxs/prior Treatment) Patient is a 54 y.o. female presenting with shortness of breath. The history is provided by the patient.  Shortness of Breath Severity:  Moderate Onset quality:  Gradual Timing:  Constant Progression:  Worsening Chronicity:  New Context: URI   Relieved by:  Nothing Worsened by:  Activity and coughing Ineffective treatments:  None tried Associated symptoms: chest pain, cough and wheezing   Associated symptoms: no abdominal pain, no diaphoresis, no fever, no headaches, no sputum production and no vomiting   Risk factors: tobacco use   Risk factors: no hx of PE/DVT, no obesity, no prolonged immobilization and no recent surgery     Past Medical History  Diagnosis Date  . Goiter    Past Surgical History  Procedure Laterality Date  . Cesarean section      x4   Family History  Problem Relation Age of Onset  . Lymphoma Mother   . Dementia Father   . Cancer Other    History  Substance Use Topics  . Smoking status: Current Every Day Smoker -- 1.00 packs/day    Types: Cigarettes  . Smokeless tobacco: Not on file  . Alcohol Use: No   OB History    No data available     Review of Systems  Constitutional: Negative for fever, diaphoresis and fatigue.  Respiratory: Positive for cough, chest tightness, shortness of breath and wheezing. Negative for sputum production.   Cardiovascular: Positive for chest pain. Negative for palpitations.  Gastrointestinal: Negative for nausea, vomiting and abdominal pain.  Neurological: Negative for speech difficulty, weakness, light-headedness and headaches.  Psychiatric/Behavioral: Negative for confusion.  All other systems reviewed and are  negative.     Allergies  Review of patient's allergies indicates no known allergies.  Home Medications   Prior to Admission medications   Medication Sig Start Date End Date Taking? Authorizing Provider  acetaminophen (TYLENOL) 500 MG tablet Take 1,000 mg by mouth every 6 (six) hours as needed for mild pain.    Historical Provider, MD  albuterol (PROVENTIL HFA;VENTOLIN HFA) 108 (90 BASE) MCG/ACT inhaler Inhale 1-2 puffs into the lungs every 4 (four) hours as needed for wheezing or shortness of breath. 02/12/15   Marisa Severinlga Otter, MD  Aspirin-Acetaminophen-Caffeine (GOODY HEADACHE PO) Take 1 packet by mouth daily as needed (headache / general pain).    Historical Provider, MD  cyclobenzaprine (FLEXERIL) 10 MG tablet Take 1 tablet (10 mg total) by mouth 2 (two) times daily as needed for muscle spasms. 03/31/15   Hannah Muthersbaugh, PA-C  ibuprofen (ADVIL,MOTRIN) 200 MG tablet Take 600-800 mg by mouth every 6 (six) hours as needed for mild pain.    Historical Provider, MD  meloxicam (MOBIC) 15 MG tablet Take 1 tablet (15 mg total) by mouth daily. 03/31/15   Hannah Muthersbaugh, PA-C  oxyCODONE-acetaminophen (PERCOCET) 5-325 MG per tablet Take 1-2 tablets by mouth every 8 (eight) hours as needed. 03/31/15   Hannah Muthersbaugh, PA-C   BP 109/70 mmHg  Pulse 93  Temp(Src) 97.7 F (36.5 C) (Oral)  Ht 5' (1.524 m)  Wt 125 lb (56.7 kg)  BMI 24.41 kg/m2  SpO2 96%  LMP  (LMP Unknown) Physical Exam  Constitutional: She is oriented to person, place, and time. She appears well-developed and  well-nourished. No distress.  HENT:  Head: Normocephalic and atraumatic.  Nose: Nose normal.  Mouth/Throat: Oropharynx is clear and moist. No oropharyngeal exudate.  Eyes: EOM are normal. Pupils are equal, round, and reactive to light.  Neck: Normal range of motion. Neck supple.  Cardiovascular: Normal rate, regular rhythm, normal heart sounds and intact distal pulses.   No murmur heard. Pulmonary/Chest: Effort  normal. No respiratory distress. She has wheezes. She exhibits tenderness.  Decreased aeration with diffuse wheezing bilaterally.  Frequent dry cough.  Mild diffuse chest tenderness to palpation and bracing with coughing.     Abdominal: Soft. There is no tenderness. There is no rebound and no guarding.  Musculoskeletal: Normal range of motion. She exhibits no tenderness.  Lymphadenopathy:    She has no cervical adenopathy.  Neurological: She is alert and oriented to person, place, and time. No cranial nerve deficit. Coordination normal.  Skin: Skin is warm and dry. She is not diaphoretic.  Psychiatric: She has a normal mood and affect. Her behavior is normal. Judgment and thought content normal.  Nursing note and vitals reviewed.   ED Course  Procedures (including critical care time) Labs Review Labs Reviewed - No data to display  Imaging Review Dg Chest 2 View  04/25/2015   CLINICAL DATA:  54 year old female with productive cough and shortness of breath.  EXAM: CHEST  2 VIEW  COMPARISON:  Radiograph dated 02/21/2015  FINDINGS: The heart size and mediastinal contours are within normal limits. Both lungs are clear. The visualized skeletal structures are unremarkable.  IMPRESSION: No active cardiopulmonary disease.   Electronically Signed   By: Elgie Collard M.D.   On: 04/25/2015 23:30     EKG Interpretation   Date/Time:  Sunday April 25 2015 22:11:20 EDT Ventricular Rate:  94 PR Interval:  134 QRS Duration: 76 QT Interval:  350 QTC Calculation: 437 R Axis:   83 Text Interpretation:  Normal sinus rhythm Normal ECG Confirmed by POLLINA   MD, CHRISTOPHER [phone]) on 04/25/2015 10:19:34 PM      MDM   Final diagnoses:  Wheezing  SOB (shortness of breath)  URI (upper respiratory infection)   Pt is a 54 yo F with hx of asthma and tobacco use who presents with 1 week of cough and progressive SOB.  No hx of COPD but reports she doesn't see doctors frequently.  Has had a dry cough  for several days that is now associated with pleuritic chest pain with coughing.  No chest pain at rest or sensation of chest pressure.  Has a hx of wheezing and previously has used albuterol, but ran out.  Denies fever, chills, or abdominal sx.   Patient had decreased aeration with diffuse wheezing bilaterally.  Recurrent dry cough.  Mild diffuse chest tenderness to palpation and bracing with coughing.    Sounds like wheezing associated URI.  Will treat with duonebs and prednisone.  CXR and EKG ordered.  Patient continued to have significant pain with coughing, so was given IM dilaudid.    Airways opened up and work of breathing improved after 1 duoneb, so given a total of 3.  CXR shows no signs of opacity.    Given 6 puffs of albuterol MDI.  Now with significantly improved WOB, only mild wheezing.  Feels much improved since presentation.    Likely wheezing associated with URI. Advised to take 4 puffs of albuterol q 4-6 hours for the next 24 hours, then drop to 2 puffs every 4-6 hours, then PRN.  Given Rx for prednisone burst x 5 days.  Given # to call to establish care at the [facility].  Discussed the importance of stopping smoking.  Advised on ED return precautions and all questions were answered prior to dc home in stable condition.    If performed, labs, EKGs, and imaging were reviewed and interpreted by myself and my attending, and incorporated in the medical decision making.  Patient was seen with ED Attending, Dr. Nada Maclachlan, MD   Lenell Antu, MD 04/25/15 2440  Gilda Crease, MD 04/26/15 Marlyne Beards

## 2015-04-25 NOTE — ED Notes (Addendum)
Patient presents to ED with reports of shortness of breath - worsened during past 2 days, and coughing. Wheezing noted throughout all lung fields. Reports pain in lower back d/t coughing spells.

## 2016-05-31 ENCOUNTER — Emergency Department (HOSPITAL_COMMUNITY)
Admission: EM | Admit: 2016-05-31 | Discharge: 2016-05-31 | Disposition: A | Discharge status: Home / Self Care | Payer: Medicaid Other | Attending: Emergency Medicine | Admitting: Emergency Medicine

## 2016-05-31 ENCOUNTER — Encounter (HOSPITAL_COMMUNITY): Payer: Self-pay

## 2016-05-31 ENCOUNTER — Emergency Department (HOSPITAL_COMMUNITY): Payer: Medicaid Other

## 2016-05-31 DIAGNOSIS — Y999 Unspecified external cause status: Secondary | ICD-10-CM | POA: Insufficient documentation

## 2016-05-31 DIAGNOSIS — S39012A Strain of muscle, fascia and tendon of lower back, initial encounter: Secondary | ICD-10-CM

## 2016-05-31 DIAGNOSIS — M549 Dorsalgia, unspecified: Secondary | ICD-10-CM | POA: Diagnosis present

## 2016-05-31 DIAGNOSIS — Y939 Activity, unspecified: Secondary | ICD-10-CM | POA: Insufficient documentation

## 2016-05-31 DIAGNOSIS — R109 Unspecified abdominal pain: Secondary | ICD-10-CM | POA: Insufficient documentation

## 2016-05-31 DIAGNOSIS — W228XXA Striking against or struck by other objects, initial encounter: Secondary | ICD-10-CM | POA: Diagnosis not present

## 2016-05-31 DIAGNOSIS — Y929 Unspecified place or not applicable: Secondary | ICD-10-CM | POA: Diagnosis not present

## 2016-05-31 DIAGNOSIS — N200 Calculus of kidney: Secondary | ICD-10-CM | POA: Diagnosis not present

## 2016-05-31 DIAGNOSIS — S29012A Strain of muscle and tendon of back wall of thorax, initial encounter: Secondary | ICD-10-CM | POA: Diagnosis not present

## 2016-05-31 DIAGNOSIS — F17210 Nicotine dependence, cigarettes, uncomplicated: Secondary | ICD-10-CM | POA: Diagnosis not present

## 2016-05-31 LAB — HEPATIC FUNCTION PANEL
ALT: 17 U/L (ref 14–54)
AST: 24 U/L (ref 15–41)
Albumin: 4.4 g/dL (ref 3.5–5.0)
Alkaline Phosphatase: 74 U/L (ref 38–126)
BILIRUBIN DIRECT: 0.1 mg/dL (ref 0.1–0.5)
BILIRUBIN INDIRECT: 0.8 mg/dL (ref 0.3–0.9)
TOTAL PROTEIN: 7.6 g/dL (ref 6.5–8.1)
Total Bilirubin: 0.9 mg/dL (ref 0.3–1.2)

## 2016-05-31 LAB — BASIC METABOLIC PANEL
ANION GAP: 9 (ref 5–15)
BUN: 15 mg/dL (ref 6–20)
CALCIUM: 9.6 mg/dL (ref 8.9–10.3)
CO2: 24 mmol/L (ref 22–32)
Chloride: 103 mmol/L (ref 101–111)
Creatinine, Ser: 0.75 mg/dL (ref 0.44–1.00)
GFR calc Af Amer: >60 mL/min (ref >60)
Glucose, Bld: 97 mg/dL (ref 65–99)
POTASSIUM: 4 mmol/L (ref 3.5–5.1)
SODIUM: 136 mmol/L (ref 135–145)

## 2016-05-31 LAB — URINE MICROSCOPIC-ADD ON

## 2016-05-31 LAB — CBC
HEMATOCRIT: 44.1 % (ref 36.0–46.0)
Hemoglobin: 14.4 g/dL (ref 12.0–15.0)
MCH: 30.5 pg (ref 26.0–34.0)
MCHC: 32.7 g/dL (ref 30.0–36.0)
MCV: 93.4 fL (ref 78.0–100.0)
Platelets: 335 10*3/uL (ref 150–400)
RBC: 4.72 MIL/uL (ref 3.87–5.11)
RDW: 13.8 % (ref 11.5–15.5)
WBC: 7 10*3/uL (ref 4.0–10.5)

## 2016-05-31 LAB — URINALYSIS, ROUTINE W REFLEX MICROSCOPIC
BILIRUBIN URINE: NEGATIVE
Glucose, UA: NEGATIVE mg/dL
KETONES UR: NEGATIVE mg/dL
LEUKOCYTES UA: NEGATIVE
NITRITE: NEGATIVE
PH: 6 (ref 5.0–8.0)
PROTEIN: NEGATIVE mg/dL
Specific Gravity, Urine: 1.019 (ref 1.005–1.030)

## 2016-05-31 LAB — I-STAT TROPONIN, ED: TROPONIN I, POC: 0 ng/mL (ref 0.00–0.08)

## 2016-05-31 LAB — LIPASE, BLOOD: LIPASE: 29 U/L (ref 11–51)

## 2016-05-31 LAB — D-DIMER, QUANTITATIVE (NOT AT ARMC)

## 2016-05-31 MED ORDER — TRAMADOL HCL 50 MG PO TABS: 50.0000 mg | ORAL_TABLET | Freq: Four times a day (QID) | ORAL | 0 refills | Status: DC | PRN

## 2016-05-31 MED ORDER — SODIUM CHLORIDE 0.9 % IV BOLUS (SEPSIS)
1000.0000 mL | Freq: Once | INTRAVENOUS | Status: AC
  Administered 2016-05-31: 1000 mL via INTRAVENOUS

## 2016-05-31 MED ORDER — KETOROLAC TROMETHAMINE 30 MG/ML IJ SOLN
30.0000 mg | Freq: Once | INTRAMUSCULAR | Status: AC
  Administered 2016-05-31: 30 mg via INTRAVENOUS
  Filled 2016-05-31: qty 1

## 2016-05-31 MED ORDER — ONDANSETRON HCL 4 MG/2ML IJ SOLN
4.0000 mg | Freq: Once | INTRAMUSCULAR | Status: AC
Start: 2016-05-31 — End: 2016-05-31
  Administered 2016-05-31: 4 mg via INTRAVENOUS
  Filled 2016-05-31: qty 2

## 2016-05-31 MED ORDER — MORPHINE SULFATE (PF) 4 MG/ML IV SOLN
4.0000 mg | Freq: Once | INTRAVENOUS | Status: AC
  Administered 2016-05-31: 4 mg via INTRAVENOUS
  Filled 2016-05-31: qty 1

## 2016-05-31 MED ORDER — DIAZEPAM 5 MG/ML IJ SOLN
5.0000 mg | Freq: Once | INTRAMUSCULAR | Status: AC
  Administered 2016-05-31: 5 mg via INTRAVENOUS
  Filled 2016-05-31: qty 2

## 2016-05-31 NOTE — ED Notes (Signed)
Patient comes in with left sided chest/upper abd pain that started 2-3 weeks ago and then got worse in the last few days. Patient states she has been nauseous and sweaty. Denies dizziness and vomiting. LBM this AM. Patient states she's had diarrhea the last few days. Hx of kidney stones. Denies GI or cardiac hx.
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
## 2016-05-31 NOTE — ED Triage Notes (Signed)
Per Pt, Pt is coming from home with complaints of left upper abdominal/rib pain that started two weeks ago. Pt reports now have left sided chest pain that is associated with nausea and sweating.

## 2016-05-31 NOTE — Discharge Instructions (Signed)
Take motrin for pain.   Take tramadol for severe pain and muscle spasms.   See your doctor  You have kidney stones but they are likely not causing your pain. You can follow up with urologist if you want to   Return to ER if you have severe pain, chest pain, vomiting, fevers

## 2016-05-31 NOTE — ED Notes (Signed)
Contacted lab tech regarding add on of labs, reports will process labs at this time.

## 2016-07-06 ENCOUNTER — Emergency Department (HOSPITAL_COMMUNITY)
Admission: EM | Admit: 2016-07-06 | Discharge: 2016-07-06 | Disposition: A | Discharge status: Home / Self Care | Payer: Medicaid Other | Attending: Dermatology | Admitting: Dermatology

## 2016-07-06 ENCOUNTER — Encounter (HOSPITAL_COMMUNITY): Payer: Self-pay | Admitting: Emergency Medicine

## 2016-07-06 DIAGNOSIS — F10239 Alcohol dependence with withdrawal, unspecified: Secondary | ICD-10-CM | POA: Diagnosis not present

## 2016-07-06 DIAGNOSIS — F17210 Nicotine dependence, cigarettes, uncomplicated: Secondary | ICD-10-CM | POA: Diagnosis not present

## 2016-07-06 DIAGNOSIS — Z5321 Procedure and treatment not carried out due to patient leaving prior to being seen by health care provider: Secondary | ICD-10-CM | POA: Diagnosis not present

## 2016-07-06 NOTE — ED Notes (Signed)
Pt seen leaving the ED, when calling to pt she continued out of the door

## 2016-07-06 NOTE — ED Triage Notes (Signed)
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 2 beers yesterday. States the last time she was sober was [id number].

## 2016-07-06 NOTE — ED Triage Notes (Signed)
Pt from home via EMS requesting detox from ETOH. Pt drank 24 oz of beer today, but had a stint of sobriety prior to that. Pt is ambulatory and alert and oriented. Pt has stressors due to her family and living situation that lead to her drinking today.

## 2016-07-07 ENCOUNTER — Encounter (HOSPITAL_COMMUNITY): Payer: Self-pay | Admitting: Emergency Medicine

## 2016-07-07 ENCOUNTER — Emergency Department (HOSPITAL_COMMUNITY)
Admission: EM | Admit: 2016-07-07 | Discharge: 2016-07-07 | Disposition: A | Discharge status: Home / Self Care | Payer: Medicaid Other | Attending: Dermatology | Admitting: Dermatology

## 2016-07-07 DIAGNOSIS — Z79899 Other long term (current) drug therapy: Secondary | ICD-10-CM | POA: Insufficient documentation

## 2016-07-07 DIAGNOSIS — Z0000 Encounter for general adult medical examination without abnormal findings: Secondary | ICD-10-CM | POA: Diagnosis not present

## 2016-07-07 DIAGNOSIS — F17210 Nicotine dependence, cigarettes, uncomplicated: Secondary | ICD-10-CM | POA: Diagnosis not present

## 2016-07-07 DIAGNOSIS — Z5321 Procedure and treatment not carried out due to patient leaving prior to being seen by health care provider: Secondary | ICD-10-CM | POA: Insufficient documentation

## 2016-07-07 NOTE — ED Notes (Signed)
Pt told Weston Brassick from security that she was leaving.  Pt not seen in the lobby at this time.  Will d/c pt, AMA LWBS.

## 2016-07-07 NOTE — ED Notes (Signed)
Pt informed Tami Butler, NT that she was having difficulty breathing.  This Clinical research associatewriter assessed pt in curtained area in triage. Pt was able to speak in full sentences, respirations were even and unlabored, lungs sounds clear b/l by auscultation.  When this writer informed pt of findings related to lungs pt stated, "Well I been here four fucking hours."  Pt then walked w/ a steady gait back to main area of lobby.

## 2016-07-07 NOTE — ED Triage Notes (Signed)
Pt came back in. Pt was told she had to check back in. Pt became upset and refused. Pt requested to speak with the charge nurse who told pt she is welcome to check back in to be seen. Pt then attempted to call 911 from inside the lobby. [employer] explained to the pt that she can not do that. Pt then aggred to check back in. Pt then refused to put on an arm band for registration, but eventually aggred to. Pt was aggressive and verbally combative while this RN recollected vital signs, but she consented to have vital signs taken

## 2016-07-07 NOTE — ED Notes (Signed)
Pt states she wants to go outside, Pt asked to stay in lobby so we will know where she is. Pt stated she would just throw up in the floor. Pt given emesis bag.

## 2016-08-01 ENCOUNTER — Encounter: Payer: Self-pay | Admitting: Family Medicine

## 2016-08-01 ENCOUNTER — Ambulatory Visit: Payer: Medicaid Other | Attending: Family Medicine | Admitting: Family Medicine

## 2016-08-01 ENCOUNTER — Telehealth: Payer: Self-pay | Admitting: Family Medicine

## 2016-08-01 VITALS — BP 120/76 | HR 90 | Temp 98.3°F | Ht 60.0 in | Wt 116.2 lb

## 2016-08-01 DIAGNOSIS — Z79899 Other long term (current) drug therapy: Secondary | ICD-10-CM | POA: Insufficient documentation

## 2016-08-01 DIAGNOSIS — F329 Major depressive disorder, single episode, unspecified: Secondary | ICD-10-CM | POA: Diagnosis not present

## 2016-08-01 DIAGNOSIS — G8929 Other chronic pain: Secondary | ICD-10-CM | POA: Diagnosis not present

## 2016-08-01 DIAGNOSIS — R109 Unspecified abdominal pain: Secondary | ICD-10-CM | POA: Diagnosis not present

## 2016-08-01 DIAGNOSIS — N200 Calculus of kidney: Secondary | ICD-10-CM | POA: Diagnosis not present

## 2016-08-01 DIAGNOSIS — F419 Anxiety disorder, unspecified: Secondary | ICD-10-CM | POA: Insufficient documentation

## 2016-08-01 DIAGNOSIS — Z9889 Other specified postprocedural states: Secondary | ICD-10-CM | POA: Diagnosis not present

## 2016-08-01 DIAGNOSIS — J449 Chronic obstructive pulmonary disease, unspecified: Secondary | ICD-10-CM | POA: Diagnosis present

## 2016-08-01 DIAGNOSIS — I429 Cardiomyopathy, unspecified: Secondary | ICD-10-CM | POA: Diagnosis present

## 2016-08-01 DIAGNOSIS — F32A Depression, unspecified: Secondary | ICD-10-CM

## 2016-08-01 DIAGNOSIS — F418 Other specified anxiety disorders: Secondary | ICD-10-CM | POA: Diagnosis not present

## 2016-08-01 DIAGNOSIS — R350 Frequency of micturition: Secondary | ICD-10-CM | POA: Insufficient documentation

## 2016-08-01 DIAGNOSIS — Z720 Tobacco use: Secondary | ICD-10-CM | POA: Insufficient documentation

## 2016-08-01 LAB — POCT URINALYSIS DIPSTICK
Bilirubin, UA: NEGATIVE
GLUCOSE UA: NEGATIVE
Ketones, UA: NEGATIVE
NITRITE UA: POSITIVE
PH UA: 6
RBC UA: NEGATIVE
Urobilinogen, UA: 0.2

## 2016-08-01 MED ORDER — METHOCARBAMOL 500 MG PO TABS
500.0000 mg | ORAL_TABLET | Freq: Four times a day (QID) | ORAL | 1 refills | Status: DC | PRN
Start: 2016-08-01 — End: 2016-10-23

## 2016-08-01 MED ORDER — ALBUTEROL SULFATE HFA 108 (90 BASE) MCG/ACT IN AERS: 1.0000 | INHALATION_SPRAY | RESPIRATORY_TRACT | 3 refills | Status: DC | PRN

## 2016-08-01 MED ORDER — HYDROXYZINE PAMOATE 25 MG PO CAPS: 25.0000 mg | ORAL_CAPSULE | Freq: Three times a day (TID) | ORAL | 3 refills | Status: DC | PRN

## 2016-08-01 MED ORDER — VARENICLINE TARTRATE 0.5 MG PO TABS: 0.5000 mg | ORAL_TABLET | Freq: Two times a day (BID) | ORAL | 2 refills | Status: DC

## 2016-08-01 MED ORDER — MELOXICAM 7.5 MG PO TABS: 7.5000 mg | ORAL_TABLET | Freq: Every day | ORAL | 1 refills | Status: DC

## 2016-08-01 MED ORDER — BUDESONIDE-FORMOTEROL FUMARATE 160-4.5 MCG/ACT IN AERO: 2.0000 | INHALATION_SPRAY | Freq: Two times a day (BID) | RESPIRATORY_TRACT | 3 refills | Status: DC

## 2016-08-01 NOTE — Telephone Encounter (Signed)
Patient is needing to be prescribed Chantix to help stop smoking. Please follow up.

## 2016-08-01 NOTE — Patient Instructions (Signed)
Kidney Stones °Kidney stones (urolithiasis) are deposits that form inside your kidneys. The intense pain is caused by the stone moving through the urinary tract. When the stone moves, the ureter goes into spasm around the stone. The stone is usually passed in the urine.  °CAUSES  °· A disorder that makes certain neck glands produce too much parathyroid hormone (primary hyperparathyroidism). °· A buildup of uric acid crystals, similar to gout in your joints. °· Narrowing (stricture) of the ureter. °· A kidney obstruction present at birth (congenital obstruction). °· Previous surgery on the kidney or ureters. °· Numerous kidney infections. °SYMPTOMS  °· Feeling sick to your stomach (nauseous). °· Throwing up (vomiting). °· Blood in the urine (hematuria). °· Pain that usually spreads (radiates) to the groin. °· Frequency or urgency of urination. °DIAGNOSIS  °· Taking a history and physical exam. °· Blood or urine tests. °· CT scan. °· Occasionally, an examination of the inside of the urinary bladder (cystoscopy) is performed. °TREATMENT  °· Observation. °· Increasing your fluid intake. °· Extracorporeal shock wave lithotripsy--This is a noninvasive procedure that uses shock waves to break up kidney stones. °· Surgery may be needed if you have severe pain or persistent obstruction. There are various surgical procedures. Most of the procedures are performed with the use of small instruments. Only small incisions are needed to accommodate these instruments, so recovery time is minimized. °The size, location, and chemical composition are all important variables that will determine the proper choice of action for you. Talk to your health care provider to better understand your situation so that you will minimize the risk of injury to yourself and your kidney.  °HOME CARE INSTRUCTIONS  °· Drink enough water and fluids to keep your urine clear or pale yellow. This will help you to pass the stone or stone fragments. °· Strain  all urine through the provided strainer. Keep all particulate matter and stones for your health care provider to see. The stone causing the pain may be as small as a grain of salt. It is very important to use the strainer each and every time you pass your urine. The collection of your stone will allow your health care provider to analyze it and verify that a stone has actually passed. The stone analysis will often identify what you can do to reduce the incidence of recurrences. °· Only take over-the-counter or prescription medicines for pain, discomfort, or fever as directed by your health care provider. °· Keep all follow-up visits as told by your health care provider. This is important. °· Get follow-up X-rays if required. The absence of pain does not always mean that the stone has passed. It may have only stopped moving. If the urine remains completely obstructed, it can cause loss of kidney function or even complete destruction of the kidney. It is your responsibility to make sure X-rays and follow-ups are completed. Ultrasounds of the kidney can show blockages and the status of the kidney. Ultrasounds are not associated with any radiation and can be performed easily in a matter of minutes. °· Make changes to your daily diet as told by your health care provider. You may be told to: °¨ Limit the amount of salt that you eat. °¨ Eat 5 or more servings of fruits and vegetables each day. °¨ Limit the amount of meat, poultry, fish, and eggs that you eat. °· Collect a 24-hour urine sample as told by your health care provider. You may need to collect another urine sample every 6-12   months. °SEEK MEDICAL CARE IF: °· You experience pain that is progressive and unresponsive to any pain medicine you have been prescribed. °SEEK IMMEDIATE MEDICAL CARE IF:  °· Pain cannot be controlled with the prescribed medicine. °· You have a fever or shaking chills. °· The severity or intensity of pain increases over 18 hours and is not  relieved by pain medicine. °· You develop a new onset of abdominal pain. °· You feel faint or pass out. °· You are unable to urinate. °  °This information is not intended to replace advice given to you by your health care provider. Make sure you discuss any questions you have with your health care provider. °  °Document Released: 09/25/2005 Document Revised: 06/16/2015 Document Reviewed: 02/26/2013 °Elsevier Interactive Patient Education ©2016 [employer] ° °

## 2016-08-01 NOTE — Progress Notes (Signed)
Subjective:  Patient ID: Tami Butler, female    DOB: 1961-08-19  Age: 55 y.o. MRN: [id number]  CC: Nephrolithiasis; Goiter; COPD; and Cardiomyopathy   HPI Tami Butler presents today to establish care.Medical history significant for nephrolithiasis, COPD, depression and anxiety.  She has had ED visits for renal calculi and most recent CT from 05/31/16 revealed less than 2 mm bilateral nonobstructing nephrolithiasis. She does have intermittent bilateral flank pain which is worse on the right at this time and rated at a 4/10 at this time and tramadol has helped her symptoms in the past. Also has urinary urgency and frequency but no lower abdominal pain and no fever. Requests referral to urology.  Also has COPD and is currently not using any inhalers; continues to smoke, like to quit smoking  Would like to get back on medication for treatment of depression and anxiety; was previously on Vistaril but does not remember the name of the antidepressant she took.   Past Medical History:  Diagnosis Date  . Goiter     Past Surgical History:  Procedure Laterality Date  . CESAREAN SECTION     x4  . TUMOR REMOVAL      No Known Allergies  Outpatient Medications Prior to Visit  Medication Sig Dispense Refill  . ibuprofen (ADVIL,MOTRIN) 200 MG tablet Take 800 mg by mouth every 6 (six) hours as needed for headache or moderate pain.    Marland Kitchen oxyCODONE-acetaminophen (PERCOCET) 5-325 MG per tablet Take 1-2 tablets by mouth every 8 (eight) hours as needed. (Patient not taking: Reported on 08/01/2016) 15 tablet 0  . albuterol (PROVENTIL HFA;VENTOLIN HFA) 108 (90 BASE) MCG/ACT inhaler Inhale 1-2 puffs into the lungs every 4 (four) hours as needed for wheezing or shortness of breath. (Patient not taking: Reported on 08/01/2016) 1 Inhaler 0  . cyclobenzaprine (FLEXERIL) 10 MG tablet Take 1 tablet (10 mg total) by mouth 2 (two) times daily as needed for muscle spasms. (Patient not taking: Reported on  08/01/2016) 20 tablet 0  . meloxicam (MOBIC) 15 MG tablet Take 1 tablet (15 mg total) by mouth daily. (Patient not taking: Reported on 08/01/2016) 30 tablet 0  . traMADol (ULTRAM) 50 MG tablet Take 1 tablet (50 mg total) by mouth every 6 (six) hours as needed. (Patient not taking: Reported on 08/01/2016) 12 tablet 0   No facility-administered medications prior to visit.     ROS Review of Systems  Constitutional: Negative for activity change, appetite change and fatigue.  HENT: Negative for congestion, sinus pressure and sore throat.   Eyes: Negative for visual disturbance.  Respiratory: Negative for cough, chest tightness, shortness of breath and wheezing.   Cardiovascular: Negative for chest pain and palpitations.  Gastrointestinal: Negative for abdominal distention, abdominal pain and constipation.  Endocrine: Negative for polydipsia.  Genitourinary:       See history of present illness  Musculoskeletal: Negative for arthralgias and back pain.  Skin: Negative for rash.  Neurological: Negative for tremors, light-headedness and numbness.  Hematological: Does not bruise/bleed easily.  Psychiatric/Behavioral: Negative for agitation and behavioral problems.    Objective:  BP 120/76 (BP Location: Right Arm, Patient Position: Sitting, Cuff Size: Small)   Pulse 90   Temp 98.3 F (36.8 C) (Oral)   Ht 5' (1.524 m)   Wt 116 lb 3.2 oz (52.7 kg)   SpO2 97%   BMI 22.69 kg/m   BP/Weight 08/01/2016 07/07/2016 07/06/2016  Systolic BP 120 102 101  Diastolic BP 76 80 76  Wt. (Lbs) 116.2 - -  BMI 22.69 - -      Physical Exam  Constitutional: She is oriented to person, place, and time. She appears well-developed and well-nourished.  Cardiovascular: Normal rate, normal heart sounds and intact distal pulses.   No murmur heard. Pulmonary/Chest: Effort normal and breath sounds normal. She has no wheezes. She has no rales. She exhibits no tenderness.  Abdominal: Soft. Bowel sounds are normal.  She exhibits no distension and no mass. There is no tenderness.  Musculoskeletal: Normal range of motion. She exhibits tenderness (left CVA tenderness).  Neurological: She is alert and oriented to person, place, and time.  Skin: Skin is warm and dry.  Psychiatric: She has a normal mood and affect.     Assessment & Plan:   1. Anxiety and depression Patient will get back to me at her next visit with the name of the antidepressant she took previously - hydrOXYzine (VISTARIL) 25 MG capsule; Take 1 capsule (25 mg total) by mouth 3 (three) times daily as needed.  Dispense: 90 capsule; Refill: 3  2. COPD mixed type (HCC)  stable - albuterol (PROVENTIL HFA;VENTOLIN HFA) 108 (90 Base) MCG/ACT inhaler; Inhale 1-2 puffs into the lungs every 4 (four) hours as needed for wheezing or shortness of breath.  Dispense: 1 Inhaler; Refill: 3 - budesonide-formoterol (SYMBICORT) 160-4.5 MCG/ACT inhaler; Inhale 2 puffs into the lungs 2 (two) times daily.  Dispense: 1 Inhaler; Refill: 3  3. Tobacco abuse commenced on Chantix  4. Kidney stones Referred to urology due to frequency of recurring kidney stones. Placed on Robaxin - meloxicam (MOBIC) 7.5 MG tablet; Take 1 tablet (7.5 mg total) by mouth daily.  Dispense: 30 tablet; Refill: 1  5. Other chronic pain - Drug Screen, Urine   Meds ordered this encounter  Medications  . albuterol (PROVENTIL HFA;VENTOLIN HFA) 108 (90 Base) MCG/ACT inhaler    Sig: Inhale 1-2 puffs into the lungs every 4 (four) hours as needed for wheezing or shortness of breath.    Dispense:  1 Inhaler    Refill:  3  . budesonide-formoterol (SYMBICORT) 160-4.5 MCG/ACT inhaler    Sig: Inhale 2 puffs into the lungs 2 (two) times daily.    Dispense:  1 Inhaler    Refill:  3  . meloxicam (MOBIC) 7.5 MG tablet    Sig: Take 1 tablet (7.5 mg total) by mouth daily.    Dispense:  30 tablet    Refill:  1  . hydrOXYzine (VISTARIL) 25 MG capsule    Sig: Take 1 capsule (25 mg total) by  mouth 3 (three) times daily as needed.    Dispense:  90 capsule    Refill:  3    Follow-up: Return in about 3 weeks (around 08/22/2016) for Complete physical exam.   Jaclyn ShaggyEnobong Amao MD

## 2016-08-09 LAB — DRUG ABUSE PANEL 10-50, U
AMPHETAMINE: POSITIVE — AB
AMPHETAMINES (1000 NG/ML SCRN): POSITIVE — AB
BARBITURATES: NEGATIVE
BENZODIAZEPINES: POSITIVE — AB
COCAINE METABOLITES: NEGATIVE
MARIJUANA MET (50 NG/ML SCRN): POSITIVE — AB
METHADONE: NEGATIVE
METHAQUALONE: NEGATIVE
OPIATES: NEGATIVE
PHENCYCLIDINE: NEGATIVE
PROPOXYPHENE: NEGATIVE

## 2016-08-10 ENCOUNTER — Telehealth: Payer: Self-pay

## 2016-08-10 NOTE — Telephone Encounter (Signed)
Writer called patient to discuss lab results per Dr. Venetia NightAmao.  LVM requesting patient to return the call.

## 2016-08-10 NOTE — Telephone Encounter (Signed)
-----   Message from Enobong Amao, MD sent at 08/10/2016  8:31 AM EDT ----- Drug screen is positive for marijuana, benzodiazepines, amphetamines which should not normally be present. 

## 2016-08-14 ENCOUNTER — Other Ambulatory Visit: Payer: Self-pay | Admitting: Pharmacist

## 2016-08-14 ENCOUNTER — Telehealth: Payer: Self-pay

## 2016-08-14 MED ORDER — VARENICLINE TARTRATE 0.5 MG X 11 & 1 MG X 42 PO MISC: ORAL | 0 refills | Status: DC

## 2016-08-14 NOTE — Telephone Encounter (Signed)
Writer called patient and LVM requesting a call back to discuss labs.

## 2016-08-14 NOTE — Telephone Encounter (Signed)
Writer called patient to let her know that the requested Chantix prescription is waiting for her at the front desk.

## 2016-08-14 NOTE — Telephone Encounter (Signed)
-----   Message from Jaclyn ShaggyEnobong Amao, MD sent at 08/10/2016  8:31 AM EDT ----- Drug screen is positive for marijuana, benzodiazepines, amphetamines which should not normally be present.

## 2016-08-17 ENCOUNTER — Telehealth: Payer: Self-pay

## 2016-08-17 NOTE — Telephone Encounter (Signed)
-----   Message from Enobong Amao, MD sent at 08/10/2016  8:31 AM EDT ----- Drug screen is positive for marijuana, benzodiazepines, amphetamines which should not normally be present. 

## 2016-08-17 NOTE — Telephone Encounter (Signed)
After many attempts of trying to reach patient by phone writer sent a letter with test results from labs on 08/01/16.

## 2016-08-18 ENCOUNTER — Encounter: Payer: Self-pay | Admitting: Family Medicine

## 2016-10-23 ENCOUNTER — Encounter (HOSPITAL_COMMUNITY): Payer: Self-pay | Admitting: Emergency Medicine

## 2016-10-23 ENCOUNTER — Emergency Department (HOSPITAL_COMMUNITY)
Admission: EM | Admit: 2016-10-23 | Discharge: 2016-10-23 | Disposition: A | Discharge status: Home / Self Care | Payer: Medicaid Other | Attending: Emergency Medicine | Admitting: Emergency Medicine

## 2016-10-23 DIAGNOSIS — Y92410 Unspecified street and highway as the place of occurrence of the external cause: Secondary | ICD-10-CM | POA: Diagnosis not present

## 2016-10-23 DIAGNOSIS — Y999 Unspecified external cause status: Secondary | ICD-10-CM | POA: Insufficient documentation

## 2016-10-23 DIAGNOSIS — Z79899 Other long term (current) drug therapy: Secondary | ICD-10-CM | POA: Insufficient documentation

## 2016-10-23 DIAGNOSIS — J449 Chronic obstructive pulmonary disease, unspecified: Secondary | ICD-10-CM | POA: Diagnosis not present

## 2016-10-23 DIAGNOSIS — F17210 Nicotine dependence, cigarettes, uncomplicated: Secondary | ICD-10-CM | POA: Diagnosis not present

## 2016-10-23 DIAGNOSIS — M545 Low back pain: Secondary | ICD-10-CM | POA: Diagnosis not present

## 2016-10-23 DIAGNOSIS — Y939 Activity, unspecified: Secondary | ICD-10-CM | POA: Insufficient documentation

## 2016-10-23 DIAGNOSIS — S40022A Contusion of left upper arm, initial encounter: Secondary | ICD-10-CM | POA: Diagnosis not present

## 2016-10-23 HISTORY — DX: Migraine, unspecified, not intractable, without status migrainosus: G43.909

## 2016-10-23 MED ORDER — ONDANSETRON 4 MG PO TBDP: 4.0000 mg | ORAL_TABLET | Freq: Three times a day (TID) | ORAL | 0 refills | Status: DC | PRN

## 2016-10-23 MED ORDER — TRAMADOL HCL 50 MG PO TABS: 50.0000 mg | ORAL_TABLET | Freq: Four times a day (QID) | ORAL | 0 refills | Status: DC | PRN

## 2016-10-23 MED ORDER — METHOCARBAMOL 500 MG PO TABS: 500.0000 mg | ORAL_TABLET | Freq: Two times a day (BID) | ORAL | 0 refills | Status: DC

## 2016-10-23 MED ORDER — IBUPROFEN 600 MG PO TABS: 600.0000 mg | ORAL_TABLET | Freq: Four times a day (QID) | ORAL | 0 refills | Status: DC | PRN
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
## 2016-10-23 NOTE — Discharge Instructions (Signed)
Take your medications as prescribed as needed for pain relief. I also recommend resting and applying ice and/or heat to affected area for 15-20 minutes 3-4 times daily for additional pain relief. Follow-up with your primary care provider in the next 4-5 days ago symptoms have not improved or worsened. Please return to the Emergency Department if symptoms worsen or new onset of fever, neck stiffness, visual changes, confusion, chest pain, difficulty breathing, abdominal pain, numbness, tingling, groin anesthesia, loss of bowel or bladder, weakness, syncope, seizure.

## 2017-03-19 ENCOUNTER — Emergency Department (HOSPITAL_COMMUNITY): Payer: Medicaid Other

## 2017-03-19 ENCOUNTER — Emergency Department (HOSPITAL_COMMUNITY)
Admission: EM | Admit: 2017-03-19 | Discharge: 2017-03-19 | Disposition: A | Discharge status: Home / Self Care | Payer: Medicaid Other | Attending: Emergency Medicine | Admitting: Emergency Medicine

## 2017-03-19 ENCOUNTER — Encounter (HOSPITAL_COMMUNITY): Payer: Self-pay

## 2017-03-19 DIAGNOSIS — F17210 Nicotine dependence, cigarettes, uncomplicated: Secondary | ICD-10-CM | POA: Insufficient documentation

## 2017-03-19 DIAGNOSIS — S92101A Unspecified fracture of right talus, initial encounter for closed fracture: Secondary | ICD-10-CM | POA: Diagnosis not present

## 2017-03-19 DIAGNOSIS — S92001A Unspecified fracture of right calcaneus, initial encounter for closed fracture: Secondary | ICD-10-CM

## 2017-03-19 DIAGNOSIS — Y999 Unspecified external cause status: Secondary | ICD-10-CM | POA: Insufficient documentation

## 2017-03-19 DIAGNOSIS — X509XXA Other and unspecified overexertion or strenuous movements or postures, initial encounter: Secondary | ICD-10-CM | POA: Diagnosis not present

## 2017-03-19 DIAGNOSIS — Y92009 Unspecified place in unspecified non-institutional (private) residence as the place of occurrence of the external cause: Secondary | ICD-10-CM | POA: Insufficient documentation

## 2017-03-19 DIAGNOSIS — S92011A Displaced fracture of body of right calcaneus, initial encounter for closed fracture: Secondary | ICD-10-CM | POA: Diagnosis not present

## 2017-03-19 DIAGNOSIS — J449 Chronic obstructive pulmonary disease, unspecified: Secondary | ICD-10-CM | POA: Diagnosis not present

## 2017-03-19 DIAGNOSIS — Y9301 Activity, walking, marching and hiking: Secondary | ICD-10-CM | POA: Diagnosis not present

## 2017-03-19 DIAGNOSIS — S92254A Nondisplaced fracture of navicular [scaphoid] of right foot, initial encounter for closed fracture: Secondary | ICD-10-CM | POA: Diagnosis not present

## 2017-03-19 DIAGNOSIS — S99921A Unspecified injury of right foot, initial encounter: Secondary | ICD-10-CM | POA: Diagnosis present

## 2017-03-19 DIAGNOSIS — Z79899 Other long term (current) drug therapy: Secondary | ICD-10-CM | POA: Insufficient documentation

## 2017-03-19 MED ORDER — OXYCODONE-ACETAMINOPHEN 5-325 MG PO TABS: 2.0000 | ORAL_TABLET | Freq: Four times a day (QID) | ORAL | 0 refills | Status: DC | PRN

## 2017-03-19 MED ORDER — DICLOFENAC SODIUM 50 MG PO TBEC: 50.0000 mg | DELAYED_RELEASE_TABLET | Freq: Two times a day (BID) | ORAL | 0 refills | Status: DC

## 2017-03-19 MED ORDER — OXYCODONE-ACETAMINOPHEN 5-325 MG PO TABS
1.0000 | ORAL_TABLET | Freq: Once | ORAL | Status: AC
  Administered 2017-03-19: 1 via ORAL
  Filled 2017-03-19: qty 1

## 2017-03-19 NOTE — Discharge Instructions (Signed)
Call the orthopedic doctor for follow up. Return here as needed for worsening symptoms.  Do no do any activity that may cause injury while taking the narcotic as it will make you sleepy.

## 2017-03-19 NOTE — Progress Notes (Signed)
Orthopedic Tech Progress Note Patient Details:  Tami Butler 29-May-1961 [id number]  Ortho Devices Type of Ortho Device: Crutches, Post (short leg) splint Ortho Device/Splint Location: applied post short leg splint to pt right foot/ankle.  pt tolerated well.  applied fitted and trained pt for crutches for pt right foot ankle.  to ambulated fair.   Ortho Device/Splint Interventions: Application, Adjustment   Alvina ChouWilliams, Kiran Lapine C 03/19/2017, 9:54 PM

## 2017-03-19 NOTE — ED Notes (Signed)
Patient to xray.

## 2017-03-19 NOTE — ED Triage Notes (Signed)
Pt states she was walking down steps and rolled her ankle. Pt has foot stabilized with ace wrap. Cap refill <3 seconds. Able to move her toes.

## 2017-03-19 NOTE — ED Provider Notes (Signed)
[facility]-EMERGENCY DEPT Provider Note   CSN: [id number] Arrival date & time: 03/19/17  1828  By signing my name below, I, Doreatha MartinEva Mathews, attest that this documentation has been prepared under the direction and in the presence of  [facility] M. Damian LeavellNeese, NP. Electronically Signed: Doreatha MartinEva Mathews, ED Scribe. 03/19/17. 7:32 PM.    History   Chief Complaint Chief Complaint  Patient presents with  . Ankle Pain    HPI Tami Butler is a 56 y.o. female who presents to the Emergency Department complaining of moderate, constant right foot/ankle pain, swelling and tingling s/p injury that occurred earlier today. Pt states she rolled her ankle on a step and fell to the ground. She denies LOC or head injury. Pt states pain is worsened with movement and alleviated with stabilization, ice and Motrin. Pt denies additional injuries.   The history is provided by the patient. No language interpreter was used.  Ankle Pain   The incident occurred 3 to 5 hours ago. The incident occurred at home. The injury mechanism was a fall. The pain is present in the right foot. The pain is moderate. The pain has been constant since onset. Associated symptoms include tingling. She reports no foreign bodies present. The symptoms are aggravated by bearing weight. She has tried NSAIDs, immobilization and ice for the symptoms. The treatment provided mild relief.    Past Medical History:  Diagnosis Date  . Goiter   . Migraines     Patient Active Problem List   Diagnosis Date Noted  . Anxiety and depression 08/01/2016  . 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 Tobacco abuse 08/01/2016  . Kidney stones 08/01/2016    Past Surgical History:  Procedure Laterality Date  . CESAREAN SECTION     x4  . TUMOR REMOVAL      OB History    No data available       Home Medications    Prior to Admission medications   Medication Sig Start Date End Date Taking? Authorizing Provider  albuterol (PROVENTIL HFA;VENTOLIN HFA) 108 (90 Base)  MCG/ACT inhaler Inhale 1-2 puffs into the lungs every 4 (four) hours as needed for wheezing or shortness of breath. 08/01/16   Jaclyn ShaggyAmao, Enobong, MD  budesonide-formoterol (SYMBICORT) 160-4.5 MCG/ACT inhaler Inhale 2 puffs into the lungs 2 (two) times daily. 08/01/16   Jaclyn ShaggyAmao, Enobong, MD  diclofenac (VOLTAREN) 50 MG EC tablet Take 1 tablet (50 mg total) by mouth 2 (two) times daily. 03/19/17   Janne NapoleonNeese, Ambrosio Reuter M, NP  hydrOXYzine (VISTARIL) 25 MG capsule Take 1 capsule (25 mg total) by mouth 3 (three) times daily as needed. 08/01/16   Jaclyn ShaggyAmao, Enobong, MD  ibuprofen (ADVIL,MOTRIN) 600 MG tablet Take 1 tablet (600 mg total) by mouth every 6 (six) hours as needed. 10/23/16   Barrett HenleNadeau, Nicole [name], PA-C  meloxicam (MOBIC) 7.5 MG tablet Take 1 tablet (7.5 mg total) by mouth daily. 08/01/16   Jaclyn ShaggyAmao, Enobong, MD  methocarbamol (ROBAXIN) 500 MG tablet Take 1 tablet (500 mg total) by mouth 2 (two) times daily. 10/23/16   Barrett HenleNadeau, Nicole [name], PA-C  ondansetron (ZOFRAN ODT) 4 MG disintegrating tablet Take 1 tablet (4 mg total) by mouth every 8 (eight) hours as needed for nausea or vomiting. 10/23/16   Barrett HenleNadeau, Nicole [name], PA-C  oxyCODONE-acetaminophen (PERCOCET/ROXICET) 5-325 MG tablet Take 2 tablets by mouth every 6 (six) hours as needed for severe pain. 03/19/17   Janne NapoleonNeese, Briella Hobday M, NP  traMADol (ULTRAM) 50 MG tablet Take 1 tablet (50 mg total)  by mouth every 6 (six) hours as needed. 10/23/16   Barrett Henle, PA-C  varenicline (CHANTIX STARTING MONTH PAK) 0.5 MG X 11 & 1 MG X 42 tablet Take one 0.5 mg tablet by mouth once daily for 3 days, then increase to one 0.5 mg tablet twice daily for 4 days, then increase to one 1 mg tablet twice daily. 08/14/16   Jaclyn Shaggy, MD    Family History Family History  Problem Relation Age of Onset  . Lymphoma Mother   . Dementia Father   . Cancer Other     Social History Social History  Substance Use Topics  . Smoking status: Current Every Day Smoker    Packs/day:  0.50    Types: Cigarettes  . Smokeless tobacco: Never Used  . Alcohol use Yes     Comment: occ     Allergies   Patient has no known allergies.   Review of Systems Review of Systems  Constitutional: Negative for fever.  HENT: Negative.   Gastrointestinal: Negative for nausea.  Musculoskeletal: Positive for arthralgias and joint swelling.  Skin: Negative for wound.  Neurological: Positive for tingling. Negative for syncope.  Psychiatric/Behavioral: The patient is not nervous/anxious.      Physical Exam Updated Vital Signs BP (!) 156/85 (BP Location: Left Arm)   Pulse 84   Temp 98.6 F (37 C) (Oral)   Resp 16   SpO2 100%   Physical Exam  Constitutional: She appears well-developed and well-nourished. No distress.  HENT:  Head: Normocephalic.  Eyes: Conjunctivae are normal.  Neck: Neck supple.  Cardiovascular: Normal rate.   Pedal pulses 2+  Pulmonary/Chest: Effort normal.  Musculoskeletal: Normal range of motion. She exhibits edema and tenderness.       Right foot: There is tenderness, bony tenderness and swelling. There is normal capillary refill, no deformity and no laceration. Decreased range of motion: due to pain.  Tenderness to the lateral and medial aspect with swelling and bruising to the right foot on the lateral aspect.   Neurological: She is alert.  Skin: Skin is warm and dry. Capillary refill takes less than 2 seconds.  Adequate circulation   Psychiatric: She has a normal mood and affect. Her behavior is normal.  Nursing note and vitals reviewed.    ED Treatments / Results   DIAGNOSTIC STUDIES: Oxygen Saturation is 99% on RA, normal by my interpretation.    COORDINATION OF CARE: 7:30 PM Discussed treatment plan with pt at bedside which includes XR and pt agreed to plan.   Radiology Dg Ankle Complete Right  Result Date: 03/19/2017 CLINICAL DATA:  Ankle injury while walking and basement. Pain and swelling laterally. EXAM: RIGHT ANKLE - COMPLETE 3+  VIEW COMPARISON:  None. FINDINGS: No evidence for gross fracture. No subluxation or dislocation. Ankle mortise is preserved. Tiny avulsion fragment identified along the dorsal aspect the distal talus. IMPRESSION: Tiny cortical avulsion along the dorsal aspect of the anterior talus. Electronically Signed   By: Kennith Center M.D.   On: 03/19/2017 20:01   Dg Foot Complete Right  Result Date: 03/19/2017 CLINICAL DATA:  Inversion injury with pain. EXAM: RIGHT FOOT COMPLETE - 3+ VIEW COMPARISON:  None. FINDINGS: Three views study shows an acute fracture involving the medial aspect of the navicular. Talonavicular orientation is preserved. Tiny avulsion fragment seen along the lateral aspect of the calcaneus. Lateral film shows cortical avulsion fragment along the dorsal aspect of the anterior talus. Otherwise no acute bony abnormality is evident. IMPRESSION: Acute  fracture involving the medial navicular. Cortical avulsion injury anterior talus and lateral aspect of the anterior calcaneus. Electronically Signed   By: Kennith Center M.D.   On: 03/19/2017 20:04    Procedures Procedures (including critical care time)  Medications Ordered in ED Medications  oxyCODONE-acetaminophen (PERCOCET/ROXICET) 5-325 MG per tablet 1 tablet (1 tablet Oral Given 03/19/17 2001)   X-rays reviewed with Dr. Lynelle Doctor, posterior splint, non weight bearing, crutches and f/u with ortho.   Initial Impression / Assessment and Plan / ED Course  I have reviewed the triage vital signs and the nursing notes. Patient X-Ray shows Acute fracture involving the medial navicular. Cortical avulsion injury anterior talus and lateral aspect of the anterior calcaneus.  Pt advised to follow up with orthopedics. Patient given posterior splint, crutches, and pain managed while in ED, conservative therapy recommended and discussed. Patient will be discharged home & is agreeable with above plan. Returns precautions discussed. Pt appears safe for discharge  without focal neuro deficits.  Final Clinical Impressions(s) / ED Diagnoses   Final diagnoses:  Closed nondisplaced fracture of navicular bone of right foot, initial encounter  Closed displaced fracture of right talus, unspecified fracture morphology, initial encounter  Closed nondisplaced fracture of right calcaneus, unspecified portion of calcaneus, initial encounter    New Prescriptions Discharge Medication List as of 03/19/2017  9:01 PM    START taking these medications   Details  diclofenac (VOLTAREN) 50 MG EC tablet Take 1 tablet (50 mg total) by mouth 2 (two) times daily., Starting Mon 03/19/2017, Print        I personally performed the services described in this documentation, which was scribed in my presence. The recorded information has been reviewed and is accurate.    Kerrie [place], [place] 03/19/17 2215    Linwood Dibbles, MD 03/21/17 [phone]

## 2017-05-04 ENCOUNTER — Emergency Department (HOSPITAL_COMMUNITY): Payer: Medicaid Other

## 2017-05-04 ENCOUNTER — Encounter (HOSPITAL_COMMUNITY): Payer: Self-pay | Admitting: Emergency Medicine

## 2017-05-04 ENCOUNTER — Emergency Department (HOSPITAL_COMMUNITY)
Admission: EM | Admit: 2017-05-04 | Discharge: 2017-05-04 | Disposition: A | Discharge status: Home / Self Care | Payer: Medicaid Other | Attending: Emergency Medicine | Admitting: Emergency Medicine

## 2017-05-04 DIAGNOSIS — M25571 Pain in right ankle and joints of right foot: Secondary | ICD-10-CM | POA: Insufficient documentation

## 2017-05-04 DIAGNOSIS — Z5321 Procedure and treatment not carried out due to patient leaving prior to being seen by health care provider: Secondary | ICD-10-CM | POA: Insufficient documentation

## 2017-05-04 MED ORDER — OXYCODONE-ACETAMINOPHEN 5-325 MG PO TABS
ORAL_TABLET | ORAL | Status: AC
  Filled 2017-05-04: qty 1

## 2017-05-04 MED ORDER — OXYCODONE-ACETAMINOPHEN 5-325 MG PO TABS
1.0000 | ORAL_TABLET | ORAL | Status: DC | PRN
  Administered 2017-05-04: 1 via ORAL

## 2017-05-04 NOTE — ED Notes (Signed)
Husband rolling pt out in wheelchair and states they are leaving.  Had encouraged pt to stay and explained triage process and informed him only a few more patients in front of pt to go to treatment room.

## 2017-05-04 NOTE — ED Notes (Signed)
Updated on wait for treatment room. 

## 2017-05-04 NOTE — ED Notes (Signed)
When vitals recheck in waiting room pts leg was proped up on chair.  Pt said that made he leg feel somewhat better but was still in pain.

## 2017-05-04 NOTE — ED Triage Notes (Signed)
Pt to ER after ankle injury occurred this afternoon. States just taken out of cast for recent right ankle fracture. States got up really fast to do something and heard a snap. Unable to bear weight, unable to move ankle, swelling noted. A/o x4.

## 2017-05-19 ENCOUNTER — Encounter (HOSPITAL_COMMUNITY): Payer: Self-pay | Admitting: Emergency Medicine

## 2017-05-19 ENCOUNTER — Emergency Department (HOSPITAL_COMMUNITY)
Admission: EM | Admit: 2017-05-19 | Discharge: 2017-05-19 | Disposition: A | Discharge status: Home / Self Care | Payer: Medicaid Other | Attending: Emergency Medicine | Admitting: Emergency Medicine

## 2017-05-19 ENCOUNTER — Emergency Department (HOSPITAL_COMMUNITY): Payer: Medicaid Other

## 2017-05-19 DIAGNOSIS — Y999 Unspecified external cause status: Secondary | ICD-10-CM | POA: Diagnosis not present

## 2017-05-19 DIAGNOSIS — Y929 Unspecified place or not applicable: Secondary | ICD-10-CM | POA: Diagnosis not present

## 2017-05-19 DIAGNOSIS — Z79899 Other long term (current) drug therapy: Secondary | ICD-10-CM | POA: Diagnosis not present

## 2017-05-19 DIAGNOSIS — X509XXA Other and unspecified overexertion or strenuous movements or postures, initial encounter: Secondary | ICD-10-CM | POA: Insufficient documentation

## 2017-05-19 DIAGNOSIS — S86001A Unspecified injury of right Achilles tendon, initial encounter: Secondary | ICD-10-CM | POA: Insufficient documentation

## 2017-05-19 DIAGNOSIS — Y9389 Activity, other specified: Secondary | ICD-10-CM | POA: Insufficient documentation

## 2017-05-19 DIAGNOSIS — F17210 Nicotine dependence, cigarettes, uncomplicated: Secondary | ICD-10-CM | POA: Insufficient documentation

## 2017-05-19 DIAGNOSIS — J449 Chronic obstructive pulmonary disease, unspecified: Secondary | ICD-10-CM | POA: Diagnosis not present

## 2017-05-19 DIAGNOSIS — S99921A Unspecified injury of right foot, initial encounter: Secondary | ICD-10-CM | POA: Diagnosis present

## 2017-05-19 MED ORDER — HYDROCODONE-ACETAMINOPHEN 5-325 MG PO TABS
1.0000 | ORAL_TABLET | Freq: Once | ORAL | Status: AC
  Administered 2017-05-19: 1 via ORAL
  Filled 2017-05-19: qty 1

## 2017-05-19 MED ORDER — HYDROCODONE-ACETAMINOPHEN 5-325 MG PO TABS: 1.0000 | ORAL_TABLET | Freq: Four times a day (QID) | ORAL | 0 refills | Status: DC | PRN

## 2017-05-19 MED ORDER — MORPHINE SULFATE (PF) 4 MG/ML IV SOLN
4.0000 mg | Freq: Once | INTRAVENOUS | Status: AC
  Administered 2017-05-19: 4 mg via INTRAVENOUS
  Filled 2017-05-19: qty 1

## 2017-05-19 NOTE — ED Notes (Signed)
ED Provider at bedside. 

## 2017-05-19 NOTE — ED Notes (Signed)
Made Ortho tech aware of Cam walker ordered.

## 2017-05-19 NOTE — ED Notes (Signed)
[facility] Expected date:  Expected time:  Means of arrival:  Comments: Foot swelling-old injury

## 2017-05-19 NOTE — ED Triage Notes (Signed)
Per [facility] patient from home for right pain and swelling more on medial side for past couple days.  Patient fell couple months ago and had fractures in right foot. Patient recently jumped when getting feet tied up around puppy causing more injury to right foot.  Patient given  Fentanyl via IV in route. 20g right forearm.

## 2017-05-19 NOTE — ED Notes (Signed)
Patient requesting something to drink. Spoke with Thayer Ohmhris PA, wants to wait until after xray of foot before patient can have anything by mouth.

## 2017-05-19 NOTE — ED Notes (Signed)
Patient transported to X-ray 

## 2017-05-19 NOTE — Discharge Instructions (Signed)
Return here as needed.  Follow-up with your orthopedist as soon as possible.  Ice and elevate, wear the cam walker.  Do not weight bear until you follow-up with your orthopedist

## 2017-05-19 NOTE — ED Provider Notes (Signed)
[facility]-EMERGENCY DEPT Provider Note   CSN: [id number] Arrival date & time: 05/19/17  0940     History   Chief Complaint Chief Complaint  Patient presents with  . Foot Pain    right    HPI Tami Butler is a 56 y.o. female.  HPI Patient presents to the emergency department with right foot and heel pain.  Patient states that nothing seems to make the condition better, but movement and palpation make the pain worse.  The patient states she did not take any medications prior to route.  She states she reinjured her foot 2 weeks ago.  She states that she has been seeing an orthopedist about her fractures in her foot.  Patient states that she has not seen her orthopedist in over a month.  Patient denies numbness, weakness, back pain, headache, blurred vision, shortness of breath or chest pain Past Medical History:  Diagnosis Date  . Goiter   . Migraines     Patient Active Problem List   Diagnosis Date Noted  . Anxiety and depression 08/01/2016  . 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 Tobacco abuse 08/01/2016  . Kidney stones 08/01/2016    Past Surgical History:  Procedure Laterality Date  . CESAREAN SECTION     x4  . TUMOR REMOVAL      OB History    No data available       Home Medications    Prior to Admission medications   Medication Sig Start Date End Date Taking? Authorizing Provider  albuterol (PROVENTIL HFA;VENTOLIN HFA) 108 (90 Base) MCG/ACT inhaler Inhale 1-2 puffs into the lungs every 4 (four) hours as needed for wheezing or shortness of breath. 08/01/16   Jaclyn Shaggy, MD  budesonide-formoterol (SYMBICORT) 160-4.5 MCG/ACT inhaler Inhale 2 puffs into the lungs 2 (two) times daily. 08/01/16   Jaclyn Shaggy, MD  diclofenac (VOLTAREN) 50 MG EC tablet Take 1 tablet (50 mg total) by mouth 2 (two) times daily. 03/19/17   Janne Napoleon, NP  hydrOXYzine (VISTARIL) 25 MG capsule Take 1 capsule (25 mg total) by mouth 3 (three) times daily as needed. 08/01/16   Jaclyn Shaggy, MD  ibuprofen (ADVIL,MOTRIN) 600 MG tablet Take 1 tablet (600 mg total) by mouth every 6 (six) hours as needed. 10/23/16   Barrett Henle, PA-C  meloxicam (MOBIC) 7.5 MG tablet Take 1 tablet (7.5 mg total) by mouth daily. 08/01/16   Jaclyn Shaggy, MD  methocarbamol (ROBAXIN) 500 MG tablet Take 1 tablet (500 mg total) by mouth 2 (two) times daily. 10/23/16   Barrett Henle, PA-C  ondansetron (ZOFRAN ODT) 4 MG disintegrating tablet Take 1 tablet (4 mg total) by mouth every 8 (eight) hours as needed for nausea or vomiting. 10/23/16   Barrett Henle, PA-C  oxyCODONE-acetaminophen (PERCOCET/ROXICET) 5-325 MG tablet Take 2 tablets by mouth every 6 (six) hours as needed for severe pain. 03/19/17   Janne Napoleon, NP  traMADol (ULTRAM) 50 MG tablet Take 1 tablet (50 mg total) by mouth every 6 (six) hours as needed. 10/23/16   Barrett Henle, PA-C  varenicline (CHANTIX STARTING MONTH PAK) 0.5 MG X 11 & 1 MG X 42 tablet Take one 0.5 mg tablet by mouth once daily for 3 days, then increase to one 0.5 mg tablet twice daily for 4 days, then increase to one 1 mg tablet twice daily. 08/14/16   Jaclyn Shaggy, MD    Family History Family History  Problem Relation Age of Onset  .  Lymphoma Mother   . Dementia Father   . Cancer Other     Social History Social History  Substance Use Topics  . Smoking status: Current Every Day Smoker    Packs/day: 0.50    Types: Cigarettes  . Smokeless tobacco: Never Used  . Alcohol use Yes     Comment: occ     Allergies   Patient has no known allergies.   Review of Systems Review of Systems All other systems negative except as documented in the HPI. All pertinent positives and negatives as reviewed in the HPI.  Physical Exam Updated Vital Signs BP 122/84 (BP Location: Left Arm)   Pulse 93   Temp 98.8 F (37.1 C) (Oral)   Resp 16   SpO2 97%   Physical Exam  Constitutional: She is oriented to person, place, and time.  She appears well-developed and well-nourished. No distress.  HENT:  Head: Normocephalic and atraumatic.  Eyes: Pupils are equal, round, and reactive to light.  Pulmonary/Chest: Effort normal.  Musculoskeletal:       Feet:  Neurological: She is alert and oriented to person, place, and time.  Skin: Skin is warm and dry.  Psychiatric: She has a normal mood and affect.  Nursing note and vitals reviewed.    ED Treatments / Results  Labs (all labs ordered are listed, but only abnormal results are displayed) Labs Reviewed - No data to display  EKG  EKG Interpretation None       Radiology Dg Foot Complete Right  Result Date: 05/19/2017 CLINICAL DATA:  Fall EXAM: RIGHT FOOT COMPLETE - 3+ VIEW COMPARISON:  03/19/2017 FINDINGS: Osteopenia. The fracture through the navicular is less distinct compatible with interval healing. Alignment is stable. No definite acute fracture or dislocation. Mild degenerative changes in the digits. IMPRESSION: No acute bony pathology. Healing navicular fracture. Osteopenia Electronically Signed   By: Jolaine ClickArthur  Hoss M.D.   On: 05/19/2017 11:48    Procedures Procedures (including critical care time)  Medications Ordered in ED Medications  morphine 4 MG/ML injection 4 mg (4 mg Intravenous Given 05/19/17 1139)  HYDROcodone-acetaminophen (NORCO/VICODIN) 5-325 MG per tablet 1 tablet (1 tablet Oral Given 05/19/17 1305)     Initial Impression / Assessment and Plan / ED Course  I have reviewed the triage vital signs and the nursing notes.  Pertinent labs & imaging results that were available during my care of the patient were reviewed by me and considered in my medical decision making (see chart for details).    The patient states that she does have an appointment with her orthopedist coming up this week.  Advised her that she will need to see them for further evaluation of her Achilles for possible injury.  Patient agrees the plan and all questions were  answered.  Told to the nonweightbearing until follow-up.    Final Clinical Impressions(s) / ED Diagnoses   Final diagnoses:  None    New Prescriptions New Prescriptions   No medications on file     Charlestine NightLawyer, Cobe Viney, PA-C 05/19/17 1313    Samuel JesterMcManus, Kathleen, DO 05/23/17 1531

## 2020-08-01 ENCOUNTER — Emergency Department (HOSPITAL_BASED_OUTPATIENT_CLINIC_OR_DEPARTMENT_OTHER): Payer: Self-pay

## 2020-08-01 ENCOUNTER — Other Ambulatory Visit: Payer: Self-pay

## 2020-08-01 ENCOUNTER — Emergency Department (HOSPITAL_BASED_OUTPATIENT_CLINIC_OR_DEPARTMENT_OTHER)
Admission: EM | Admit: 2020-08-01 | Discharge: 2020-08-01 | Disposition: A | Discharge status: Home / Self Care | Payer: Self-pay | Attending: Emergency Medicine | Admitting: Emergency Medicine

## 2020-08-01 ENCOUNTER — Encounter (HOSPITAL_BASED_OUTPATIENT_CLINIC_OR_DEPARTMENT_OTHER): Payer: Self-pay | Admitting: Emergency Medicine

## 2020-08-01 DIAGNOSIS — J441 Chronic obstructive pulmonary disease with (acute) exacerbation: Secondary | ICD-10-CM | POA: Insufficient documentation

## 2020-08-01 DIAGNOSIS — Z7951 Long term (current) use of inhaled steroids: Secondary | ICD-10-CM | POA: Insufficient documentation

## 2020-08-01 DIAGNOSIS — F17210 Nicotine dependence, cigarettes, uncomplicated: Secondary | ICD-10-CM | POA: Insufficient documentation

## 2020-08-01 HISTORY — DX: Chronic obstructive pulmonary disease, unspecified: J44.9

## 2020-08-01 HISTORY — DX: Unspecified osteoarthritis, unspecified site: M19.90

## 2020-08-01 LAB — CBC WITH DIFFERENTIAL/PLATELET
Abs Immature Granulocytes: 0.03 10*3/uL (ref 0.00–0.07)
Basophils Absolute: 0.1 10*3/uL (ref 0.0–0.1)
Basophils Relative: 1 %
Eosinophils Absolute: 0.3 10*3/uL (ref 0.0–0.5)
Eosinophils Relative: 3 %
HCT: 45.4 % (ref 36.0–46.0)
Hemoglobin: 13.9 g/dL (ref 12.0–15.0)
Immature Granulocytes: 0 %
Lymphocytes Relative: 20 %
Lymphs Abs: 1.7 10*3/uL (ref 0.7–4.0)
MCH: 29.6 pg (ref 26.0–34.0)
MCHC: 30.6 g/dL (ref 30.0–36.0)
MCV: 96.6 fL (ref 80.0–100.0)
Monocytes Absolute: 0.9 10*3/uL (ref 0.1–1.0)
Monocytes Relative: 11 %
Neutro Abs: 5.5 10*3/uL (ref 1.7–7.7)
Neutrophils Relative %: 65 %
Platelets: 246 10*3/uL (ref 150–400)
RBC: 4.7 MIL/uL (ref 3.87–5.11)
RDW: 13.9 % (ref 11.5–15.5)
WBC: 8.5 10*3/uL (ref 4.0–10.5)
nRBC: 0 % (ref 0.0–0.2)

## 2020-08-01 LAB — COMPREHENSIVE METABOLIC PANEL
ALT: 21 U/L (ref 0–44)
AST: 25 U/L (ref 15–41)
Albumin: 4.1 g/dL (ref 3.5–5.0)
Alkaline Phosphatase: 68 U/L (ref 38–126)
Anion gap: 10 (ref 5–15)
BUN: 9 mg/dL (ref 6–20)
CO2: 30 mmol/L (ref 22–32)
Calcium: 8.7 mg/dL — ABNORMAL LOW (ref 8.9–10.3)
Chloride: 96 mmol/L — ABNORMAL LOW (ref 98–111)
Creatinine, Ser: 0.49 mg/dL (ref 0.44–1.00)
GFR, Estimated: >60 mL/min (ref >60)
Glucose, Bld: 100 mg/dL — ABNORMAL HIGH (ref 70–99)
Potassium: 3.9 mmol/L (ref 3.5–5.1)
Sodium: 136 mmol/L (ref 135–145)
Total Bilirubin: 0.5 mg/dL (ref 0.3–1.2)
Total Protein: 7.3 g/dL (ref 6.5–8.1)

## 2020-08-01 MED ORDER — PREDNISONE 10 MG PO TABS: 40.0000 mg | ORAL_TABLET | Freq: Every day | ORAL | 0 refills | Status: DC

## 2020-08-01 MED ORDER — METHYLPREDNISOLONE SODIUM SUCC 125 MG IJ SOLR
125.0000 mg | Freq: Once | INTRAMUSCULAR | Status: AC
  Administered 2020-08-01: 125 mg via INTRAVENOUS
  Filled 2020-08-01: qty 2

## 2020-08-01 MED ORDER — ALBUTEROL SULFATE (2.5 MG/3ML) 0.083% IN NEBU
INHALATION_SOLUTION | RESPIRATORY_TRACT | Status: AC
  Administered 2020-08-01: 2.5 mg
  Filled 2020-08-01: qty 3

## 2020-08-01 MED ORDER — IPRATROPIUM-ALBUTEROL 0.5-2.5 (3) MG/3ML IN SOLN
RESPIRATORY_TRACT | Status: AC
  Administered 2020-08-01: 3 mL
  Filled 2020-08-01: qty 3

## 2020-08-01 MED ORDER — ALBUTEROL SULFATE HFA 108 (90 BASE) MCG/ACT IN AERS
8.0000 | INHALATION_SPRAY | Freq: Once | RESPIRATORY_TRACT | Status: AC
  Administered 2020-08-01: 8 via RESPIRATORY_TRACT
  Filled 2020-08-01: qty 6.7

## 2020-08-01 MED ORDER — ALBUTEROL SULFATE (5 MG/ML) 0.5% IN NEBU: 2.5000 mg | INHALATION_SOLUTION | Freq: Four times a day (QID) | RESPIRATORY_TRACT | 12 refills | Status: DC | PRN

## 2020-08-01 NOTE — ED Notes (Signed)
Ambulated from lobby to room 7.  +DOE, purse lip breathing, tripoding on stretcher, HR 110-118, SpO2 on r/a 87%.

## 2020-08-01 NOTE — Discharge Instructions (Signed)
Use the albuterol inhaler or nebulizer every 6 hours as directed.  Both renewed for you.  Start taking the prednisone 40 mg a day for the the next 5 days tomorrow.  Make an appointment to follow-up with your doctor.  Return for any new or worse symptoms.  As we discussed you were little borderline requiring admission today.  Still have some wheezing but you are improved oxygen saturations on room air are above 90 currently.  But very important return for any new or worse symptoms.

## 2020-08-01 NOTE — ED Notes (Signed)
Patient changed to r/a, SpO2 96% while taking HHN treatment.

## 2020-08-01 NOTE — ED Provider Notes (Signed)
[facility] EMERGENCY DEPARTMENT Provider Note   CSN: [id number] Arrival date & time: 08/01/20  1132     History Chief Complaint  Patient presents with  . Shortness of Breath    Tami Butler is a 59 y.o. female.  Patient with history of COPD.  Patient short of breath for the past few weeks.  Gotten worse in the last couple days.  Arrived here in distress dyspnea on exertion pursed lip breathing tripoding on the stretcher.  Oxygen saturations on room air 87%.  Patient usually uses albuterol nebulizer and inhaler and is out of that.  Patient still smokes.  She is trying to quit.  Patient denies any fevers or any other complaints.  Patient placed on oxygen here brought her oxygen levels up.  Patient does not have oxygen at home.        Past Medical History:  Diagnosis Date  . Arthritis   . COPD (chronic obstructive pulmonary disease) (HCC)   . Goiter   . Migraines     Patient Active Problem List   Diagnosis Date Noted  . Anxiety and depression 08/01/2016  . 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 Tobacco abuse 08/01/2016  . Kidney stones 08/01/2016    Past Surgical History:  Procedure Laterality Date  . CESAREAN SECTION     x4  . TUMOR REMOVAL       OB History   No obstetric history on file.     Family History  Problem Relation Age of Onset  . Lymphoma Mother   . Dementia Father   . Cancer Other     Social History   Tobacco Use  . Smoking status: Current Every Day Smoker    Packs/day: 0.50    Types: Cigarettes  . Smokeless tobacco: Never Used  Substance Use Topics  . Alcohol use: Yes    Comment: occ  . Drug use: No    Home Medications Prior to Admission medications   Medication Sig Start Date End Date Taking? Authorizing Provider  albuterol (PROVENTIL HFA;VENTOLIN HFA) 108 (90 Base) MCG/ACT inhaler Inhale 1-2 puffs into the lungs every 4 (four) hours as needed for wheezing or shortness of breath. 08/01/16   Hoy Register, MD    albuterol (PROVENTIL) (5 MG/ML) 0.5% nebulizer solution Take 0.5 mLs (2.5 mg total) by nebulization every 6 (six) hours as needed for wheezing or shortness of breath. 08/01/20   Vanetta Mulders, MD  budesonide-formoterol [facility]) 160-4.5 MCG/ACT inhaler Inhale 2 puffs into the lungs 2 (two) times daily. 08/01/16   Hoy Register, MD  diclofenac (VOLTAREN) 50 MG EC tablet Take 1 tablet (50 mg total) by mouth 2 (two) times daily. 03/19/17   Janne Napoleon, NP  HYDROcodone-acetaminophen (NORCO/VICODIN) 5-325 MG tablet Take 1 tablet by mouth every 6 (six) hours as needed for moderate pain. 05/19/17   Lawyer, Cristal Deer, PA-C  hydrOXYzine (VISTARIL) 25 MG capsule Take 1 capsule (25 mg total) by mouth 3 (three) times daily as needed. 08/01/16   Hoy Register, MD  ibuprofen (ADVIL,MOTRIN) 600 MG tablet Take 1 tablet (600 mg total) by mouth every 6 (six) hours as needed. 10/23/16   Barrett Henle, PA-C  meloxicam (MOBIC) 7.5 MG tablet Take 1 tablet (7.5 mg total) by mouth daily. 08/01/16   Hoy Register, MD  methocarbamol (ROBAXIN) 500 MG tablet Take 1 tablet (500 mg total) by mouth 2 (two) times daily. 10/23/16   Barrett Henle, PA-C  ondansetron (ZOFRAN ODT) 4 MG disintegrating tablet Take  1 tablet (4 mg total) by mouth every 8 (eight) hours as needed for nausea or vomiting. 10/23/16   Barrett Henle, PA-C  oxyCODONE-acetaminophen (PERCOCET/ROXICET) 5-325 MG tablet Take 2 tablets by mouth every 6 (six) hours as needed for severe pain. 03/19/17   Janne Napoleon, NP  predniSONE (DELTASONE) 10 MG tablet Take 4 tablets (40 mg total) by mouth daily. 08/01/20   Vanetta Mulders, MD  traMADol (ULTRAM) 50 MG tablet Take 1 tablet (50 mg total) by mouth every 6 (six) hours as needed. 10/23/16   Barrett Henle, PA-C  varenicline (CHANTIX STARTING MONTH PAK) 0.5 MG X 11 & 1 MG X 42 tablet Take one 0.5 mg tablet by mouth once daily for 3 days, then increase to one 0.5 mg tablet  twice daily for 4 days, then increase to one 1 mg tablet twice daily. 08/14/16   Hoy Register, MD    Allergies    Patient has no known allergies.  Review of Systems   Review of Systems  Constitutional: Negative for chills and fever.  HENT: Negative for congestion, rhinorrhea and sore throat.   Eyes: Negative for visual disturbance.  Respiratory: Positive for shortness of breath and wheezing. Negative for cough.   Cardiovascular: Negative for chest pain and leg swelling.  Gastrointestinal: Negative for abdominal pain, diarrhea, nausea and vomiting.  Genitourinary: Negative for dysuria.  Musculoskeletal: Negative for back pain and neck pain.  Skin: Negative for rash.  Neurological: Negative for dizziness, light-headedness and headaches.  Hematological: Does not bruise/bleed easily.  Psychiatric/Behavioral: Negative for confusion.    Physical Exam Updated Vital Signs BP (!) 133/97 (BP Location: Left Arm)   Pulse (!) 104   Temp 98.6 F (37 C) (Oral)   Resp 18   Ht 1.524 m (5')   Wt 44.7 kg   SpO2 93%   BMI 19.24 kg/m   Physical Exam Vitals and nursing note reviewed.  Constitutional:      General: She is in acute distress.     Appearance: Normal appearance. She is well-developed.  HENT:     Head: Normocephalic and atraumatic.  Eyes:     Extraocular Movements: Extraocular movements intact.     Conjunctiva/sclera: Conjunctivae normal.     Pupils: Pupils are equal, round, and reactive to light.  Cardiovascular:     Rate and Rhythm: Normal rate and regular rhythm.     Heart sounds: No murmur heard.   Pulmonary:     Effort: Respiratory distress present.     Breath sounds: Wheezing present.  Abdominal:     Palpations: Abdomen is soft.     Tenderness: There is no abdominal tenderness.  Musculoskeletal:        General: Normal range of motion.     Cervical back: Normal range of motion and neck supple.  Skin:    General: Skin is warm and dry.     Capillary Refill:  Capillary refill takes less than 2 seconds.  Neurological:     General: No focal deficit present.     Mental Status: She is alert and oriented to person, place, and time.     Cranial Nerves: No cranial nerve deficit.     Sensory: No sensory deficit.     ED Results / Procedures / Treatments   Labs (all labs ordered are listed, but only abnormal results are displayed) Labs Reviewed  COMPREHENSIVE METABOLIC PANEL - Abnormal; Notable for the following components:      Result Value   Chloride  96 (*)    Glucose, Bld 100 (*)    Calcium 8.7 (*)    All other components within normal limits  CBC WITH DIFFERENTIAL/PLATELET    EKG None  Radiology DG Chest Port 1 View  Result Date: 08/01/2020 CLINICAL DATA:  Shortness of breath, COPD EXAM: PORTABLE CHEST 1 VIEW COMPARISON:  [id number] FINDINGS: Cardiomediastinal contours are stable. Central pulmonary vascular prominence with similar appearance to the prior study. No signs of pulmonary edema. No effusion. On limited assessment no acute skeletal process. IMPRESSION: Stable exam. No acute cardiopulmonary disease. Electronically Signed   By: Donzetta Kohut M.D.   On: 08/01/2020 12:41    Procedures Procedures (including critical care time)  Medications Ordered in ED Medications  albuterol (VENTOLIN HFA) 108 (90 Base) MCG/ACT inhaler 8 puff (8 puffs Inhalation Given 08/01/20 1149)  methylPREDNISolone sodium succinate (SOLU-MEDROL) 125 mg/2 mL injection 125 mg (125 mg Intravenous Given 08/01/20 1305)  albuterol (PROVENTIL) (2.5 MG/3ML) 0.083% nebulizer solution (2.5 mg  Given 08/01/20 1259)  ipratropium-albuterol (DUONEB) 0.5-2.5 (3) MG/3ML nebulizer solution (3 mLs  Given 08/01/20 1259)    ED Course  I have reviewed the triage vital signs and the nursing notes.  Pertinent labs & imaging results that were available during my care of the patient were reviewed by me and considered in my medical decision making (see chart for details).    MDM  Rules/Calculators/A&P                          Patient presented with significant wheezing shortness of breath.  Oxygen saturations down to 87% on room air.  Started on oxygen.  Dyspnea with exertion pursed lip breathing a little bit of tripoding.  With known history of COPD.  Run out of her albuterol inhaler as well as her nebulizer.  States that she has been getting short of breath for the past few weeks.  Got significantly worse in the past 2 days.  Following initial breathing treatments patient still with significant wheezing.  Not very cleared.  Patient given Solu-Medrol.  Based on the patient's oxygen requirement felt that it was important to have her admitted to make sure she was cleared.  Patient refused admission.  We continued with breathing treatments.  And was hoping that maybe would get a chance to get Solu-Medrol on board for a little bit of time.  Patient still with some wheezing but improved.  Room air sats are in the mid 90s.  94%.  Patient's respiratory rate still up some.  But she absolutely refuses to be admitted.  She does not have home oxygen.  States that her daughter is to have a baby soon so that she is going home.  Patient currently reasonably stable for discharge.  She will return for any new or worse symptoms.  But still recommend admission.  But she refuses.    Final Clinical Impression(s) / ED Diagnoses Final diagnoses:  COPD exacerbation (HCC)    Rx / DC Orders ED Discharge Orders         Ordered    predniSONE (DELTASONE) 10 MG tablet  Daily        08/01/20 1448    albuterol (PROVENTIL) (5 MG/ML) 0.5% nebulizer solution  Every 6 hours PRN        08/01/20 1448           Vanetta Mulders, MD 08/01/20 1455

## 2020-08-01 NOTE — ED Triage Notes (Signed)
Hx of COPD, c/o worsening SOB over the past few weeks. Pt is labored, 87% on RA. RT at bedside.

## 2020-08-02 ENCOUNTER — Telehealth (HOSPITAL_BASED_OUTPATIENT_CLINIC_OR_DEPARTMENT_OTHER): Payer: Self-pay | Admitting: Emergency Medicine

## 2020-09-05 ENCOUNTER — Emergency Department (HOSPITAL_BASED_OUTPATIENT_CLINIC_OR_DEPARTMENT_OTHER): Payer: Self-pay

## 2020-09-05 ENCOUNTER — Encounter (HOSPITAL_BASED_OUTPATIENT_CLINIC_OR_DEPARTMENT_OTHER): Payer: Self-pay | Admitting: Emergency Medicine

## 2020-09-05 ENCOUNTER — Inpatient Hospital Stay (HOSPITAL_BASED_OUTPATIENT_CLINIC_OR_DEPARTMENT_OTHER)
Admission: EM | Admit: 2020-09-05 | Discharge: 2020-09-08 | DRG: 189 | Disposition: A | Discharge status: Home / Self Care | Payer: Medicaid Other | Attending: Internal Medicine | Admitting: Internal Medicine

## 2020-09-05 ENCOUNTER — Other Ambulatory Visit: Payer: Self-pay

## 2020-09-05 DIAGNOSIS — Z7951 Long term (current) use of inhaled steroids: Secondary | ICD-10-CM

## 2020-09-05 DIAGNOSIS — J441 Chronic obstructive pulmonary disease with (acute) exacerbation: Principal | ICD-10-CM

## 2020-09-05 DIAGNOSIS — Z87442 Personal history of urinary calculi: Secondary | ICD-10-CM

## 2020-09-05 DIAGNOSIS — R0902 Hypoxemia: Secondary | ICD-10-CM

## 2020-09-05 DIAGNOSIS — Z23 Encounter for immunization: Secondary | ICD-10-CM

## 2020-09-05 DIAGNOSIS — Z20822 Contact with and (suspected) exposure to covid-19: Secondary | ICD-10-CM | POA: Diagnosis present

## 2020-09-05 DIAGNOSIS — Z791 Long term (current) use of non-steroidal anti-inflammatories (NSAID): Secondary | ICD-10-CM

## 2020-09-05 DIAGNOSIS — Z825 Family history of asthma and other chronic lower respiratory diseases: Secondary | ICD-10-CM

## 2020-09-05 DIAGNOSIS — Z7952 Long term (current) use of systemic steroids: Secondary | ICD-10-CM

## 2020-09-05 DIAGNOSIS — Z807 Family history of other malignant neoplasms of lymphoid, hematopoietic and related tissues: Secondary | ICD-10-CM

## 2020-09-05 DIAGNOSIS — J9621 Acute and chronic respiratory failure with hypoxia: Principal | ICD-10-CM | POA: Diagnosis present

## 2020-09-05 DIAGNOSIS — Z609 Problem related to social environment, unspecified: Secondary | ICD-10-CM

## 2020-09-05 DIAGNOSIS — Z818 Family history of other mental and behavioral disorders: Secondary | ICD-10-CM

## 2020-09-05 DIAGNOSIS — R64 Cachexia: Secondary | ICD-10-CM | POA: Diagnosis present

## 2020-09-05 DIAGNOSIS — R710 Precipitous drop in hematocrit: Secondary | ICD-10-CM | POA: Diagnosis not present

## 2020-09-05 DIAGNOSIS — Z87891 Personal history of nicotine dependence: Secondary | ICD-10-CM

## 2020-09-05 DIAGNOSIS — Z720 Tobacco use: Secondary | ICD-10-CM | POA: Diagnosis present

## 2020-09-05 DIAGNOSIS — E46 Unspecified protein-calorie malnutrition: Secondary | ICD-10-CM | POA: Diagnosis present

## 2020-09-05 DIAGNOSIS — Z560 Unemployment, unspecified: Secondary | ICD-10-CM

## 2020-09-05 DIAGNOSIS — Z79899 Other long term (current) drug therapy: Secondary | ICD-10-CM

## 2020-09-05 DIAGNOSIS — Z681 Body mass index (BMI) 19 or less, adult: Secondary | ICD-10-CM

## 2020-09-05 LAB — COMPREHENSIVE METABOLIC PANEL
ALT: 15 U/L (ref 0–44)
AST: 23 U/L (ref 15–41)
Albumin: 4.2 g/dL (ref 3.5–5.0)
Alkaline Phosphatase: 56 U/L (ref 38–126)
Anion gap: 7 (ref 5–15)
BUN: 13 mg/dL (ref 6–20)
CO2: 29 mmol/L (ref 22–32)
Calcium: 9.1 mg/dL (ref 8.9–10.3)
Chloride: 102 mmol/L (ref 98–111)
Creatinine, Ser: 0.76 mg/dL (ref 0.44–1.00)
GFR, Estimated: >60 mL/min (ref >60)
Glucose, Bld: 88 mg/dL (ref 70–99)
Potassium: 4.1 mmol/L (ref 3.5–5.1)
Sodium: 138 mmol/L (ref 135–145)
Total Bilirubin: 0.6 mg/dL (ref 0.3–1.2)
Total Protein: 7.2 g/dL (ref 6.5–8.1)

## 2020-09-05 LAB — CBC WITH DIFFERENTIAL/PLATELET
Abs Immature Granulocytes: 0.02 10*3/uL (ref 0.00–0.07)
Basophils Absolute: 0 10*3/uL (ref 0.0–0.1)
Basophils Relative: 1 %
Eosinophils Absolute: 0.3 10*3/uL (ref 0.0–0.5)
Eosinophils Relative: 4 %
HCT: 40.2 % (ref 36.0–46.0)
Hemoglobin: 13.2 g/dL (ref 12.0–15.0)
Immature Granulocytes: 0 %
Lymphocytes Relative: 16 %
Lymphs Abs: 1 10*3/uL (ref 0.7–4.0)
MCH: 30.2 pg (ref 26.0–34.0)
MCHC: 32.8 g/dL (ref 30.0–36.0)
MCV: 92 fL (ref 80.0–100.0)
Monocytes Absolute: 0.5 10*3/uL (ref 0.1–1.0)
Monocytes Relative: 8 %
Neutro Abs: 4.1 10*3/uL (ref 1.7–7.7)
Neutrophils Relative %: 71 %
Platelets: 227 10*3/uL (ref 150–400)
RBC: 4.37 MIL/uL (ref 3.87–5.11)
RDW: 13 % (ref 11.5–15.5)
WBC: 5.9 10*3/uL (ref 4.0–10.5)
nRBC: 0 % (ref 0.0–0.2)

## 2020-09-05 LAB — RESP PANEL BY RT-PCR (FLU A&B, COVID) ARPGX2
Influenza A by PCR: NEGATIVE
Influenza B by PCR: NEGATIVE
SARS Coronavirus 2 by RT PCR: NEGATIVE

## 2020-09-05 MED ORDER — ALBUTEROL SULFATE (2.5 MG/3ML) 0.083% IN NEBU
5.0000 mg | INHALATION_SOLUTION | RESPIRATORY_TRACT | Status: DC | PRN
  Administered 2020-09-05: 5 mg via RESPIRATORY_TRACT
  Filled 2020-09-05: qty 6

## 2020-09-05 MED ORDER — HYDROCOD POLST-CPM POLST ER 10-8 MG/5ML PO SUER
5.0000 mL | Freq: Once | ORAL | Status: AC
  Administered 2020-09-05: 5 mL via ORAL
  Filled 2020-09-05: qty 5

## 2020-09-05 MED ORDER — METHYLPREDNISOLONE SODIUM SUCC 125 MG IJ SOLR
125.0000 mg | Freq: Once | INTRAMUSCULAR | Status: AC
  Administered 2020-09-05: 125 mg via INTRAVENOUS
  Filled 2020-09-05: qty 2

## 2020-09-05 MED ORDER — IPRATROPIUM BROMIDE 0.02 % IN SOLN
0.5000 mg | Freq: Once | RESPIRATORY_TRACT | Status: AC
  Administered 2020-09-05: 0.5 mg via RESPIRATORY_TRACT
  Filled 2020-09-05: qty 2.5

## 2020-09-05 MED ORDER — SODIUM CHLORIDE 0.9 % IV SOLN
INTRAVENOUS | Status: DC | PRN
  Administered 2020-09-05: 500 mL via INTRAVENOUS

## 2020-09-05 MED ORDER — SODIUM CHLORIDE 0.9 % IV BOLUS
500.0000 mL | Freq: Once | INTRAVENOUS | Status: AC
  Administered 2020-09-05: 500 mL via INTRAVENOUS

## 2020-09-05 MED ORDER — GUAIFENESIN-DM 100-10 MG/5ML PO SYRP
5.0000 mL | ORAL_SOLUTION | ORAL | Status: DC | PRN
  Filled 2020-09-05: qty 5

## 2020-09-05 MED ORDER — ALBUTEROL SULFATE (2.5 MG/3ML) 0.083% IN NEBU
5.0000 mg | INHALATION_SOLUTION | Freq: Once | RESPIRATORY_TRACT | Status: AC
  Administered 2020-09-05: 5 mg via RESPIRATORY_TRACT
  Filled 2020-09-05: qty 6

## 2020-09-05 MED ORDER — SODIUM CHLORIDE 0.9 % IV SOLN
500.0000 mg | Freq: Once | INTRAVENOUS | Status: AC
  Administered 2020-09-05: 500 mg via INTRAVENOUS
  Filled 2020-09-05: qty 500

## 2020-09-05 MED ORDER — ALBUTEROL SULFATE HFA 108 (90 BASE) MCG/ACT IN AERS
INHALATION_SPRAY | RESPIRATORY_TRACT | Status: AC
  Administered 2020-09-05: 18:00:00 4
  Filled 2020-09-05: qty 6.7

## 2020-09-05 MED ORDER — SODIUM CHLORIDE 0.9 % IV SOLN
1.0000 g | Freq: Once | INTRAVENOUS | Status: AC
  Administered 2020-09-05: 1 g via INTRAVENOUS
  Filled 2020-09-05: qty 10

## 2020-09-05 NOTE — ED Notes (Signed)
Pt given snack with verbal approval from Dr. Pickering 

## 2020-09-05 NOTE — ED Triage Notes (Signed)
SOB for several weeks. Recently treated with steroids, worsening the last few days.

## 2020-09-05 NOTE — ED Notes (Signed)
ED Provider at bedside. 

## 2020-09-05 NOTE — ED Notes (Signed)
Ambulated pt to bathroom, pt tolerated well but then came out of bathroom and said "I need the oxygen I can tell" and pt was dyspneic; pt ambulated back to room and oxygen was 82%; pt placed back on nasal cannula and RT to bedside to give treatment; primary RN notified

## 2020-09-05 NOTE — ED Provider Notes (Signed)
[facility] EMERGENCY DEPARTMENT Provider Note   CSN: [id number] Arrival date & time: 09/05/20  1746     History Chief Complaint  Patient presents with  . Shortness of Breath    Tami Butler is a 59 y.o. female.  HPI Patient presents with shortness of breath and cough.  Has had for few weeks now.  Seen in the ER around a month ago.  Given steroids.  Per patient she states she was told she should be admitted but we did not want to stay because she had a grandbaby coming.  States she was feeling better for couple days but then got worse again.  Has been coughing with some mild sputum production.  Has been told she has COPD.  States she has quit smoking since that last visit around a month ago.  States has been bringing up more sputum.  Not normally on oxygen.  No fevers.  Did not have Covid vaccination because she said she was not seeing anyone.    Past Medical History:  Diagnosis Date  . Arthritis   . COPD (chronic obstructive pulmonary disease) (HCC)   . Goiter   . Migraines     Patient Active Problem List   Diagnosis Date Noted  . Anxiety and depression 08/01/2016  . 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 Tobacco abuse 08/01/2016  . Kidney stones 08/01/2016    Past Surgical History:  Procedure Laterality Date  . CESAREAN SECTION     x4  . TUMOR REMOVAL       OB History   No obstetric history on file.     Family History  Problem Relation Age of Onset  . Lymphoma Mother   . Dementia Father   . Cancer Other     Social History   Tobacco Use  . Smoking status: Current Every Day Smoker    Packs/day: 0.50    Types: Cigarettes  . Smokeless tobacco: Never Used  Substance Use Topics  . Alcohol use: Yes    Comment: occ  . Drug use: No    Home Medications Prior to Admission medications   Medication Sig Start Date End Date Taking? Authorizing Provider  albuterol (PROVENTIL HFA;VENTOLIN HFA) 108 (90 Base) MCG/ACT inhaler Inhale 1-2 puffs into the  lungs every 4 (four) hours as needed for wheezing or shortness of breath. 08/01/16   Hoy Register, MD  albuterol (PROVENTIL) (5 MG/ML) 0.5% nebulizer solution Take 0.5 mLs (2.5 mg total) by nebulization every 6 (six) hours as needed for wheezing or shortness of breath. 08/01/20   Vanetta Mulders, MD  budesonide-formoterol [facility]) 160-4.5 MCG/ACT inhaler Inhale 2 puffs into the lungs 2 (two) times daily. 08/01/16   Hoy Register, MD  diclofenac (VOLTAREN) 50 MG EC tablet Take 1 tablet (50 mg total) by mouth 2 (two) times daily. 03/19/17   Janne Napoleon, NP  HYDROcodone-acetaminophen (NORCO/VICODIN) 5-325 MG tablet Take 1 tablet by mouth every 6 (six) hours as needed for moderate pain. 05/19/17   Lawyer, Cristal Deer, PA-C  hydrOXYzine (VISTARIL) 25 MG capsule Take 1 capsule (25 mg total) by mouth 3 (three) times daily as needed. 08/01/16   Hoy Register, MD  ibuprofen (ADVIL,MOTRIN) 600 MG tablet Take 1 tablet (600 mg total) by mouth every 6 (six) hours as needed. 10/23/16   Barrett Henle, PA-C  meloxicam (MOBIC) 7.5 MG tablet Take 1 tablet (7.5 mg total) by mouth daily. 08/01/16   Hoy Register, MD  methocarbamol (ROBAXIN) 500 MG tablet Take 1  tablet (500 mg total) by mouth 2 (two) times daily. 10/23/16   Barrett Henle, PA-C  ondansetron (ZOFRAN ODT) 4 MG disintegrating tablet Take 1 tablet (4 mg total) by mouth every 8 (eight) hours as needed for nausea or vomiting. 10/23/16   Barrett Henle, PA-C  oxyCODONE-acetaminophen (PERCOCET/ROXICET) 5-325 MG tablet Take 2 tablets by mouth every 6 (six) hours as needed for severe pain. 03/19/17   Janne Napoleon, NP  predniSONE (DELTASONE) 10 MG tablet Take 4 tablets (40 mg total) by mouth daily. 08/01/20   Vanetta Mulders, MD  traMADol (ULTRAM) 50 MG tablet Take 1 tablet (50 mg total) by mouth every 6 (six) hours as needed. 10/23/16   Barrett Henle, PA-C  varenicline (CHANTIX STARTING MONTH PAK) 0.5 MG X 11 & 1 MG  X 42 tablet Take one 0.5 mg tablet by mouth once daily for 3 days, then increase to one 0.5 mg tablet twice daily for 4 days, then increase to one 1 mg tablet twice daily. 08/14/16   Hoy Register, MD    Allergies    Patient has no known allergies.  Review of Systems   Review of Systems  Constitutional: Positive for appetite change and fatigue.  HENT: Negative for congestion.   Respiratory: Positive for cough, shortness of breath and wheezing.   Cardiovascular: Negative for chest pain.  Gastrointestinal: Negative for abdominal pain.  Genitourinary: Negative for flank pain.  Musculoskeletal: Negative for back pain.  Skin: Negative for rash.  Neurological: Negative for weakness.  Psychiatric/Behavioral: Negative for confusion.    Physical Exam Updated Vital Signs BP 111/85   Pulse (!) 110   Temp 98.6 F (37 C) (Oral)   Resp 14   SpO2 97%   Physical Exam Vitals reviewed.  HENT:     Head: Normocephalic.  Cardiovascular:     Rate and Rhythm: Tachycardia present.  Pulmonary:     Effort: Tachypnea present.     Breath sounds: Wheezing present.     Comments: Diffuse wheezes and prolonged expirations.  Worse on left.. Chest:     Chest wall: No tenderness.  Abdominal:     Tenderness: There is no abdominal tenderness.  Musculoskeletal:     Cervical back: Normal range of motion.     Right lower leg: No edema.     Left lower leg: No edema.  Skin:    General: Skin is warm.     Capillary Refill: Capillary refill takes less than 2 seconds.  Neurological:     Mental Status: She is alert and oriented to person, place, and time.     ED Results / Procedures / Treatments   Labs (all labs ordered are listed, but only abnormal results are displayed) Labs Reviewed  RESP PANEL BY RT-PCR (FLU A&B, COVID) [id number]  COMPREHENSIVE METABOLIC PANEL  CBC WITH DIFFERENTIAL/PLATELET    EKG EKG Interpretation  Date/Time:  Sunday September 05 2020 18:23:58 EST Ventricular Rate:  110 PR  Interval:    QRS Duration: 79 QT Interval:  308 QTC Calculation: 417 R Axis:   88 Text Interpretation: Sinus tachycardia Right atrial enlargement Confirmed by Benjiman Core [phone]) on 09/05/2020 7:39:47 PM   Radiology DG Chest Portable 1 View  Result Date: 09/05/2020 CLINICAL DATA:  Shortness of breath EXAM: PORTABLE CHEST 1 VIEW COMPARISON:  08/01/2020 FINDINGS: The heart size and mediastinal contours are within normal limits. Atherosclerotic calcification of the aortic knob. Chronic mild interstitial prominence. Subtly increased bibasilar interstitial markings compared to prior. No pleural  effusion or pneumothorax. The visualized skeletal structures are unremarkable. IMPRESSION: Chronic bronchitic type lung changes with subtly increased bibasilar interstitial markings compared to prior, may represent atelectasis versus developing infection. Electronically Signed   By: Duanne Guess D.O.   On: 09/05/2020 18:26    Procedures Procedures (including critical care time)  Medications Ordered in ED Medications  cefTRIAXone (ROCEPHIN) 1 g in sodium chloride 0.9 % 100 mL IVPB (1 g Intravenous New Bag/Given 09/05/20 2019)  azithromycin (ZITHROMAX) 500 mg in sodium chloride 0.9 % 250 mL IVPB (has no administration in time range)  0.9 %  sodium chloride infusion (500 mLs Intravenous New Bag/Given 09/05/20 2017)  albuterol (VENTOLIN HFA) 108 (90 Base) MCG/ACT inhaler (4 puffs  Given 09/05/20 1802)  sodium chloride 0.9 % bolus 500 mL (500 mLs Intravenous New Bag/Given 09/05/20 1854)  methylPREDNISolone sodium succinate (SOLU-MEDROL) 125 mg/2 mL injection 125 mg (125 mg Intravenous Given 09/05/20 1929)  albuterol (PROVENTIL) (2.5 MG/3ML) 0.083% nebulizer solution 5 mg (5 mg Nebulization Given 09/05/20 1945)  ipratropium (ATROVENT) nebulizer solution 0.5 mg (0.5 mg Nebulization Given 09/05/20 1945)    ED Course  I have reviewed the triage vital signs and the nursing notes.  Pertinent labs &  imaging results that were available during my care of the patient were reviewed by me and considered in my medical decision making (see chart for details).    MDM Rules/Calculators/A&P                          Patient with shortness of breath and cough.  Has had for several weeks.-Better with some steroids but then returned.  Hypoxic on room air and with ambulation.  Sounds wheezy.  Breathing treatments given.  Has a negative Covid test.  Does have more wheezing on left side compared to right so we will treat with antibiotics for potential pneumonia.  X-ray showed chronic changes and atelectasis versus pneumonia.  Even after treatment still will get hypoxic with ambulation.  Will admit to hospitalist. Final Clinical Impression(s) / ED Diagnoses Final diagnoses:  COPD exacerbation (HCC)  Hypoxia    Rx / DC Orders ED Discharge Orders    None       Benjiman Core, MD 09/05/20 2045

## 2020-09-06 ENCOUNTER — Encounter (HOSPITAL_COMMUNITY): Payer: Self-pay | Admitting: Internal Medicine

## 2020-09-06 DIAGNOSIS — J441 Chronic obstructive pulmonary disease with (acute) exacerbation: Secondary | ICD-10-CM

## 2020-09-06 DIAGNOSIS — Z609 Problem related to social environment, unspecified: Secondary | ICD-10-CM

## 2020-09-06 DIAGNOSIS — E46 Unspecified protein-calorie malnutrition: Secondary | ICD-10-CM | POA: Diagnosis present

## 2020-09-06 LAB — HIV ANTIBODY (ROUTINE TESTING W REFLEX): HIV Screen 4th Generation wRfx: NONREACTIVE

## 2020-09-06 MED ORDER — MOMETASONE FURO-FORMOTEROL FUM 200-5 MCG/ACT IN AERO
2.0000 | INHALATION_SPRAY | Freq: Two times a day (BID) | RESPIRATORY_TRACT | Status: DC
  Administered 2020-09-06 – 2020-09-08 (×4): 2 via RESPIRATORY_TRACT
  Filled 2020-09-06: qty 8.8

## 2020-09-06 MED ORDER — LORAZEPAM 2 MG/ML IJ SOLN
1.0000 mg | Freq: Once | INTRAMUSCULAR | Status: AC
  Administered 2020-09-06: 1 mg via INTRAVENOUS
  Filled 2020-09-06: qty 1

## 2020-09-06 MED ORDER — PREDNISONE 20 MG PO TABS: 40.0000 mg | ORAL_TABLET | Freq: Every day | ORAL | Status: DC

## 2020-09-06 MED ORDER — HYDRALAZINE HCL 20 MG/ML IJ SOLN: 5.0000 mg | INTRAMUSCULAR | Status: DC | PRN

## 2020-09-06 MED ORDER — ACETAMINOPHEN 325 MG PO TABS
ORAL_TABLET | ORAL | Status: AC
  Administered 2020-09-06: 650 mg via ORAL
  Filled 2020-09-06: qty 2

## 2020-09-06 MED ORDER — ONDANSETRON HCL 4 MG/2ML IJ SOLN
4.0000 mg | Freq: Four times a day (QID) | INTRAMUSCULAR | Status: DC | PRN
  Administered 2020-09-06: 4 mg via INTRAVENOUS
  Filled 2020-09-06 (×2): qty 2

## 2020-09-06 MED ORDER — DOCUSATE SODIUM 100 MG PO CAPS
100.0000 mg | ORAL_CAPSULE | Freq: Two times a day (BID) | ORAL | Status: DC
  Administered 2020-09-06 – 2020-09-08 (×5): 100 mg via ORAL
  Filled 2020-09-06 (×5): qty 1

## 2020-09-06 MED ORDER — ONDANSETRON HCL 4 MG PO TABS
4.0000 mg | ORAL_TABLET | Freq: Four times a day (QID) | ORAL | Status: DC | PRN
  Administered 2020-09-08: 4 mg via ORAL
  Filled 2020-09-06: qty 1

## 2020-09-06 MED ORDER — ALBUTEROL SULFATE (2.5 MG/3ML) 0.083% IN NEBU: 2.5000 mg | INHALATION_SOLUTION | RESPIRATORY_TRACT | Status: DC | PRN

## 2020-09-06 MED ORDER — IPRATROPIUM-ALBUTEROL 0.5-2.5 (3) MG/3ML IN SOLN
3.0000 mL | Freq: Four times a day (QID) | RESPIRATORY_TRACT | Status: DC
  Administered 2020-09-06 (×2): 3 mL via RESPIRATORY_TRACT
  Filled 2020-09-06 (×2): qty 3

## 2020-09-06 MED ORDER — ACETAMINOPHEN 325 MG PO TABS
650.0000 mg | ORAL_TABLET | Freq: Once | ORAL | Status: AC
  Administered 2020-09-06: 650 mg via ORAL
  Filled 2020-09-06: qty 2

## 2020-09-06 MED ORDER — SODIUM CHLORIDE 0.9% FLUSH
3.0000 mL | Freq: Two times a day (BID) | INTRAVENOUS | Status: DC
  Administered 2020-09-06 – 2020-09-08 (×4): 3 mL via INTRAVENOUS

## 2020-09-06 MED ORDER — LACTATED RINGERS IV SOLN: INTRAVENOUS | Status: DC

## 2020-09-06 MED ORDER — ENOXAPARIN SODIUM 40 MG/0.4ML ~~LOC~~ SOLN
40.0000 mg | Freq: Every day | SUBCUTANEOUS | Status: DC
  Administered 2020-09-06 – 2020-09-08 (×3): 40 mg via SUBCUTANEOUS
  Filled 2020-09-06 (×3): qty 0.4

## 2020-09-06 MED ORDER — ACETAMINOPHEN 325 MG PO TABS: 650.0000 mg | ORAL_TABLET | Freq: Once | ORAL | Status: AC

## 2020-09-06 MED ORDER — ADULT MULTIVITAMIN W/MINERALS CH
1.0000 | ORAL_TABLET | Freq: Every day | ORAL | Status: DC
  Administered 2020-09-06 – 2020-09-08 (×3): 1 via ORAL
  Filled 2020-09-06 (×3): qty 1

## 2020-09-06 MED ORDER — ZOLPIDEM TARTRATE 5 MG PO TABS
5.0000 mg | ORAL_TABLET | Freq: Every evening | ORAL | Status: DC | PRN
  Administered 2020-09-06 – 2020-09-07 (×2): 5 mg via ORAL
  Filled 2020-09-06 (×2): qty 1

## 2020-09-06 MED ORDER — ENSURE ENLIVE PO LIQD
237.0000 mL | Freq: Two times a day (BID) | ORAL | Status: DC
  Administered 2020-09-06 – 2020-09-08 (×4): 237 mL via ORAL

## 2020-09-06 MED ORDER — BISACODYL 5 MG PO TBEC: 5.0000 mg | DELAYED_RELEASE_TABLET | Freq: Every day | ORAL | Status: DC | PRN

## 2020-09-06 MED ORDER — METHYLPREDNISOLONE SODIUM SUCC 125 MG IJ SOLR
60.0000 mg | Freq: Two times a day (BID) | INTRAMUSCULAR | Status: AC
  Administered 2020-09-06 – 2020-09-07 (×2): 60 mg via INTRAVENOUS
  Filled 2020-09-06 (×2): qty 2

## 2020-09-06 MED ORDER — INFLUENZA VAC SPLIT QUAD 0.5 ML IM SUSY
0.5000 mL | PREFILLED_SYRINGE | INTRAMUSCULAR | Status: AC
  Administered 2020-09-08: 0.5 mL via INTRAMUSCULAR
  Filled 2020-09-06: qty 0.5

## 2020-09-06 MED ORDER — UMECLIDINIUM BROMIDE 62.5 MCG/INH IN AEPB
1.0000 | INHALATION_SPRAY | Freq: Every day | RESPIRATORY_TRACT | Status: DC
  Administered 2020-09-07 – 2020-09-08 (×2): 1 via RESPIRATORY_TRACT
  Filled 2020-09-06 (×2): qty 7

## 2020-09-06 MED ORDER — MORPHINE SULFATE (PF) 2 MG/ML IV SOLN: 2.0000 mg | INTRAVENOUS | Status: DC | PRN

## 2020-09-06 MED ORDER — POLYETHYLENE GLYCOL 3350 17 G PO PACK: 17.0000 g | PACK | Freq: Every day | ORAL | Status: DC | PRN

## 2020-09-06 MED ORDER — GUAIFENESIN 100 MG/5ML PO SOLN
5.0000 mL | ORAL | Status: DC | PRN
  Administered 2020-09-06 (×2): 100 mg via ORAL
  Filled 2020-09-06 (×2): qty 5
  Filled 2020-09-06: qty 10

## 2020-09-06 MED ORDER — ACETAMINOPHEN 325 MG PO TABS: 650.0000 mg | ORAL_TABLET | Freq: Four times a day (QID) | ORAL | Status: DC | PRN

## 2020-09-06 MED ORDER — HYDROCODONE-ACETAMINOPHEN 5-325 MG PO TABS
1.0000 | ORAL_TABLET | ORAL | Status: DC | PRN
  Administered 2020-09-06 – 2020-09-08 (×11): 2 via ORAL
  Filled 2020-09-06 (×11): qty 2

## 2020-09-06 MED ORDER — MOMETASONE FURO-FORMOTEROL FUM 100-5 MCG/ACT IN AERO
2.0000 | INHALATION_SPRAY | Freq: Two times a day (BID) | RESPIRATORY_TRACT | Status: DC
  Filled 2020-09-06: qty 8.8

## 2020-09-06 MED ORDER — ACETAMINOPHEN 650 MG RE SUPP: 650.0000 mg | Freq: Four times a day (QID) | RECTAL | Status: DC | PRN

## 2020-09-06 MED ORDER — SODIUM CHLORIDE 0.9 % IV SOLN
1.0000 g | INTRAVENOUS | Status: DC
  Filled 2020-09-06: qty 10

## 2020-09-06 NOTE — H&P (Signed)
History and Physical    Tami Butler BMW:[id number] DOB: Aug 01, 1961 DOA: 09/05/2020  PCP: Patient, No Pcp Per Consultants:  None Patient coming from: Home - lives with daughter, her husband and 2 children; NOK:  Daughter, Clinton Gallant, [phone]    Chief Complaint: SOB  HPI: Tami Butler is a 59 y.o. female with medical history significant of COPD and long-standing tobacco dependence with recent cessation who presented to [facility] with SOB.  She reports inability to catch her breath.  She went a month ago to [facility] and was given neb and Solumedrol; she absolutely refused admission.  She quit smoking at that time.  She has had problems with her breathing for a couple of years and goes to urgent care to get an inhaler or neb periodically.  This time, she got better after 10/24 visit but it gradually got worse again.   Thanksgiving, she was winded when she tried to get up at all, was unable to cook.  She has been using nebs q4h with some improvement but it wasn't getting better.  +cough with chest tightness so she tries not to cough.  She has never been on O2 before.  No fever.  No sick contacts.    ED Course:  [facility] to [facility] transfer, per Dr. Rachael Darby:  60 yo female with hx of smoking who quit a month ago. Presents to [facility] with SOB with exertion that has been worsening the past few days. Hypoxic with exertion on room air and requiring oxygen. Placed on antibiotic by ER physician.  Normal CBC. Accepted to med-surg bed   Review of Systems: As per HPI; otherwise review of systems reviewed and negative.   Ambulatory Status:  Ambulates without assistance  COVID Vaccine Status:  None - does not want injection while here  Past Medical History:  Diagnosis Date  . Arthritis   . COPD (chronic obstructive pulmonary disease) (HCC)   . Goiter   . Migraines     Past Surgical History:  Procedure Laterality Date  . CESAREAN SECTION     x4  . TUMOR REMOVAL      Social History   Socioeconomic  History  . Marital status: Single    Spouse name: Not on file  . Number of children: Not on file  . Years of education: Not on file  . Highest education level: Not on file  Occupational History  . Occupation: unemployed  Tobacco Use  . Smoking status: Former Smoker    Packs/day: 1.00    Years: 35.00    Pack years: 35.00    Types: Cigarettes    Quit date: 07/2020    Years since quitting: 0.1  . Smokeless tobacco: Never Used  Substance and Sexual Activity  . Alcohol use: Not Currently    Comment: occ  . Drug use: Yes    Types: Marijuana    Comment: occasional   . Sexual activity: Not on file  Other Topics Concern  . Not on file  Social History Narrative  . Not on file   Social Determinants of Health   Financial Resource Strain:   . Difficulty of Paying Living Expenses: Not on file  Food Insecurity:   . Worried About Programme researcher, broadcasting/film/video in the Last Year: Not on file  . Ran Out of Food in the Last Year: Not on file  Transportation Needs:   . Lack of Transportation (Medical): Not on file  . Lack of Transportation (Non-Medical): Not on file  Physical Activity:   .  Days of Exercise per Week: Not on file  . Minutes of Exercise per Session: Not on file  Stress:   . Feeling of Stress : Not on file  Social Connections:   . Frequency of Communication with Friends and Family: Not on file  . Frequency of Social Gatherings with Friends and Family: Not on file  . Attends Religious Services: Not on file  . Active Member of Clubs or Organizations: Not on file  . Attends Banker Meetings: Not on file  . Marital Status: Not on file  Intimate Partner Violence:   . Fear of Current or Ex-Partner: Not on file  . Emotionally Abused: Not on file  . Physically Abused: Not on file  . Sexually Abused: Not on file    No Known Allergies  Family History  Problem Relation Age of Onset  . Lymphoma Mother   . Dementia Father   . Cancer Other     Prior to Admission  medications   Medication Sig Start Date End Date Taking? Authorizing Provider  albuterol (PROVENTIL) (5 MG/ML) 0.5% nebulizer solution Take 0.5 mLs (2.5 mg total) by nebulization every 6 (six) hours as needed for wheezing or shortness of breath. Patient taking differently: Take 2.5 mg by nebulization every 4 (four) hours as needed for wheezing or shortness of breath.  08/01/20  Yes Vanetta Mulders, MD  albuterol (PROVENTIL HFA;VENTOLIN HFA) 108 (90 Base) MCG/ACT inhaler Inhale 1-2 puffs into the lungs every 4 (four) hours as needed for wheezing or shortness of breath. 08/01/16   Hoy Register, MD  budesonide-formoterol (SYMBICORT) 160-4.5 MCG/ACT inhaler Inhale 2 puffs into the lungs 2 (two) times daily. 08/01/16   Hoy Register, MD  diclofenac (VOLTAREN) 50 MG EC tablet Take 1 tablet (50 mg total) by mouth 2 (two) times daily. 03/19/17   Janne Napoleon, NP  HYDROcodone-acetaminophen (NORCO/VICODIN) 5-325 MG tablet Take 1 tablet by mouth every 6 (six) hours as needed for moderate pain. 05/19/17   Lawyer, Cristal Deer, PA-C  hydrOXYzine (VISTARIL) 25 MG capsule Take 1 capsule (25 mg total) by mouth 3 (three) times daily as needed. 08/01/16   Hoy Register, MD  ibuprofen (ADVIL,MOTRIN) 600 MG tablet Take 1 tablet (600 mg total) by mouth every 6 (six) hours as needed. 10/23/16   Barrett Henle, PA-C  meloxicam (MOBIC) 7.5 MG tablet Take 1 tablet (7.5 mg total) by mouth daily. 08/01/16   Hoy Register, MD  methocarbamol (ROBAXIN) 500 MG tablet Take 1 tablet (500 mg total) by mouth 2 (two) times daily. 10/23/16   Barrett Henle, PA-C  ondansetron (ZOFRAN ODT) 4 MG disintegrating tablet Take 1 tablet (4 mg total) by mouth every 8 (eight) hours as needed for nausea or vomiting. 10/23/16   Barrett Henle, PA-C  oxyCODONE-acetaminophen (PERCOCET/ROXICET) 5-325 MG tablet Take 2 tablets by mouth every 6 (six) hours as needed for severe pain. 03/19/17   Janne Napoleon, NP  predniSONE  (DELTASONE) 10 MG tablet Take 4 tablets (40 mg total) by mouth daily. 08/01/20   Vanetta Mulders, MD  traMADol (ULTRAM) 50 MG tablet Take 1 tablet (50 mg total) by mouth every 6 (six) hours as needed. 10/23/16   Barrett Henle, PA-C  varenicline (CHANTIX STARTING MONTH PAK) 0.5 MG X 11 & 1 MG X 42 tablet Take one 0.5 mg tablet by mouth once daily for 3 days, then increase to one 0.5 mg tablet twice daily for 4 days, then increase to one 1 mg tablet twice daily.  08/14/16   Hoy Register, MD    Physical Exam: Vitals:   09/06/20 0530 09/06/20 0702 09/06/20 0800 09/06/20 0925  BP: 100/76 90/62 94/66  115/80  Pulse: 91 74 83 88  Resp: 15 13 15 20   Temp:    98 F (36.7 C)  TempSrc:    Oral  SpO2: 97% 99% 99% 99%     . General:  Appears chronically ill, cachectic, with increased WOB despite Mobile O2 . Eyes:  PERRL, EOMI, normal lids, iris . ENT:  grossly normal hearing, lips & tongue, mmm . Neck:  no LAD, masses or thyromegaly . Cardiovascular:  RRR, no m/r/g. No LE edema.  Respiratory:   Increase respiratory effort, particularly with conversation.  Scattered wheezing.  Increased expiratory time. . Abdomen:  soft, NT, ND, NABS . Skin:  no rash or induration seen on limited exam . Musculoskeletal:  grossly normal tone BUE/BLE, good ROM, no bony abnormality . Psychiatric:  blunted mood and affect, speech fluent and appropriate, AOx3 . Neurologic:  CN 2-12 grossly intact, moves all extremities in coordinated fashion    Radiological Exams on Admission: Independently reviewed - see discussion in A/P where applicable  DG Chest Portable 1 View  Result Date: 09/05/2020 CLINICAL DATA:  Shortness of breath EXAM: PORTABLE CHEST 1 VIEW COMPARISON:  08/01/2020 FINDINGS: The heart size and mediastinal contours are within normal limits. Atherosclerotic calcification of the aortic knob. Chronic mild interstitial prominence. Subtly increased bibasilar interstitial markings compared to prior.  No pleural effusion or pneumothorax. The visualized skeletal structures are unremarkable. IMPRESSION: Chronic bronchitic type lung changes with subtly increased bibasilar interstitial markings compared to prior, may represent atelectasis versus developing infection. Electronically Signed   By: 08/03/2020 D.O.   On: 09/05/2020 18:26    EKG: Independently reviewed.  Sinus tachycardia with rate 110; nonspecific ST changes with no evidence of acute ischemia   Labs on Admission: I have personally reviewed the available labs and imaging studies at the time of the admission.  Pertinent labs:   Normal CMP Normal CBC COVID/flu negative   Assessment/Plan Principal Problem:   COPD exacerbation (HCC) Active Problems:   Tobacco abuse   Malnutrition related to chronic disease (HCC)   High risk social situation   Acute on chronic respiratory failure associated with a COPD exacerbation -Patient's shortness of breath and cough are most likely caused by acute COPD exacerbation in the setting of chronic uncontrolled COPD -She does not have a PCP or insurance and has not been receiving care for this issue -She does not have fever or leukocytosis.  -Chest x-ray is not consistent with pneumonia -She was given a neb treatment in the ED with some improvement.   -will admit patient - with her lack of outpatient therapy and markedly decreased O2 sats (documented as low as 81%), it seems likely that she will need several days of hospitalization to show sufficient improvement for discharge. -She appears likely to need home O2 - which is problematic since she does not have insurance. -Nebulizers: scheduled Duoneb and prn albuterol -Solu-Medrol 0 mg IV BID -> Prednisone 40 mg PO daily -IV Rocephin (she was given Rocephin and Azithromycin at [facility] but will only treat with Rocephin for now for severe COPD exacerbation)  -Pulmonology consult requested -Coordinated care with [facility] team/PT/OT/Nutrition/RT  consults  Malnutrition -BMI 19.2 -The patient has at least 2 indicators for malnutrition (insufficient energy intake, loss of muscle mass, loss of subcutaneous fat -This is likely due to chronic disease -Will  obtain a nutrition consult for further recommendations.  Tobacco dependence -Quit smoking about 1 month ago -Needs ongoing cessation -She has not been vaccinated against COVID; this has been strongly encouraged and she will consider as an outpatient  High risk social situation -She reports that she was previously told that she would not qualify for Medicaid/Medicare -She does not work (likely should qualify for disability at this time based on current evaluation) and cannot afford insurance -She needs routine care as well as possibly home O2 and likely pulmonology f/u -Would suggest [facility] and Dr. Danise MinaPat Wright -TOC consult requested   Note: This patient has been tested and is negative for the novel coronavirus COVID-19. She has not been vaccinated against COVID-19.    DVT prophylaxis: Lovenox  Code Status:  Full - confirmed with patient Family Communication: None present Disposition Plan:  The patient is from: home  Anticipated d[facility] is [facility]  Anticipated d/c date will depend on clinical response to treatment, but possibly in the next 2-3 days if she has excellent response to treatment  Patient is currently: acutely ill Consults called: Pulmonology; TOC team/PT/OT/Nutrition/RT  Admission status: Admit - It is my clinical opinion that admission to INPATIENT is reasonable and necessary because this patient will require at least 2 midnights in the hospital to treat this condition based on the medical complexity of the problems presented.  Given the aforementioned information, the predictability of an adverse outcome is felt to be significant.    Jonah BlueJennifer Hydeia Mcatee MD [facility]   How to contact the [facility] Attending  or Consulting provider 7A - 7P or covering provider during after hours 7P -7A, for this patient?  1. Check the care team in [facility] and look for a) attending/consulting TRH provider listed and b) the [facility] team listed 2. Log into [email or web address] and use [place]'s universal password to access. If you do not have the password, please contact the hospital operator. 3. Locate the [facility] provider you are looking for under [facility] and page to a number that you can be directly reached. 4. If you still have difficulty reaching the provider, please page the [facility] (Director on Call) for the Hospitalists listed on amion for assistance.   09/06/2020, 1:29 PM

## 2020-09-06 NOTE — Progress Notes (Signed)
Initial Nutrition Assessment  DOCUMENTATION CODES:   Not applicable  INTERVENTION:   -Ensure Enlive po BID, each supplement provides 350 kcal and 20 grams of protein -MVI with minerals daily  NUTRITION DIAGNOSIS:   Increased nutrient needs related to chronic illness (COPD) as evidenced by estimated needs.  GOAL:   Patient will meet greater than or equal to 90% of their needs  MONITOR:   PO intake, Supplement acceptance, Labs, Weight trends, Skin, I & O's  REASON FOR ASSESSMENT:   Consult Assessment of nutrition requirement/status  ASSESSMENT:   Patient presents with shortness of breath and cough.  Pt admitted with COPD exacerbation.  Pt asleep at time of visit with multiple blankets over their head. She did not respond to voice.   No meal completion data available at this time.   Pt with increased nutritional needs due to COPD and would benefit from addition of oral nutrition supplements.  Medications reviewed and include colace, lovenox, and solu-medrol.  Labs reviewed.   Diet Order:   Diet Order            Diet regular Room service appropriate? Yes; Fluid consistency: Thin  Diet effective now                 EDUCATION NEEDS:   No education needs have been identified at this time  Skin:  Skin Assessment: Reviewed RN Assessment  Last BM:  Unknown  Height:   Ht Readings from Last 1 Encounters:  08/01/20 5' (1.524 m)    Weight:   Wt Readings from Last 1 Encounters:  08/01/20 44.7 kg    Ideal Body Weight:  45.5 kg  BMI:  There is no height or weight on file to calculate BMI.  Estimated Nutritional Needs:   Kcal:  1550-1750  Protein:  75-90 grams  Fluid:  > 1.5 L    Levada Schilling, RD, LDN, CDCES Registered Dietitian II Certified Diabetes Care and Education Specialist Please refer to [facility] for RD and/or RD on-call/weekend/after hours pager

## 2020-09-06 NOTE — ED Notes (Signed)
[facility] notified of ready bed

## 2020-09-06 NOTE — TOC Initial Note (Addendum)
Transition of Care [facility]) - Initial/Assessment Note    Patient Details  Name: Tami Butler MRN: [id number] Date of Birth: [birth year]  [facility] CM/SW Contact:    Kingsley Plan, RN Phone Number: 09/06/2020, 4:32 PM  Clinical Narrative:                 PAtient from home with daughter and son in law. Patient has no insurance ( checked with insurance verification). Scientist, product/process development ( financial counselors).   Patient has no PCP , wants to establish care at [facility].   Explained MATCH ( medication assistance) . Patient in agreement to use Transitions of Care Pharmacy.   Possible need for home oxygen. Explained home oxygen if needed would be ordered through [employer] [facility]. They would bring portable tank to room prior to discharge. [employer] would provide hardship paperwork for patient to complete, however at time of delivery they will need a credit card number. Patient does not have a card card. NCM asked patient to discuss with her daughter and son in law. Patient voiced understanding.  [facility] their first appointment is January, they scheduled appointment at [facility] with Gwinda Passe September 15, 2020 at 1:30 pm . Patient aware and on AVS   Expected Discharge Plan: Home/Self Care Barriers to Discharge: Continued Medical Work up   Patient Goals and CMS Choice Patient states their goals for this hospitalization and ongoing recovery are:: to return to home CMS Medicare.gov Compare Post Acute Care list provided to:: Patient    Expected Discharge Plan and Services Expected Discharge Plan: Home/Self Care In-house Referral: Financial Counselor Discharge Planning Services: Indigent Health Clinic, MATCH Program, Medication Assistance   Living arrangements for the past 2 months: Single Family Home                 DME Arranged: N/A DME Agency: NA       HH Arranged: NA          Prior  Living Arrangements/Services Living arrangements for the past 2 months: Single Family Home Lives with:: Adult Children Patient language and need for interpreter reviewed:: Yes        Need for Family Participation in Patient Care: Yes (Comment) Care giver support system in place?: Yes (comment)   Criminal Activity/Legal Involvement Pertinent to Current Situation/Hospitalization: No - Comment as needed  Activities of Daily Living Home Assistive Devices/Equipment: None ADL Screening (condition at time of admission) Patient's cognitive ability adequate to safely complete daily activities?: Yes Is the patient deaf or have difficulty hearing?: No Does the patient have difficulty seeing, even when wearing glasses/contacts?: No Does the patient have difficulty concentrating, remembering, or making decisions?: No Patient able to express need for assistance with ADLs?: Yes Does the patient have difficulty dressing or bathing?: No Independently performs ADLs?: Yes (appropriate for developmental age) Does the patient have difficulty walking or climbing stairs?: No Weakness of Legs: None Weakness of Arms/Hands: None  Permission Sought/Granted   Permission granted to share information with : No              Emotional Assessment Appearance:: Appears stated age Attitude/Demeanor/Rapport: Engaged Affect (typically observed): Accepting Orientation: : Oriented to Self, Oriented to Place, Oriented to  Time, Oriented to Situation Alcohol / Substance Use: Not Applicable Psych Involvement: No (comment)  Admission diagnosis:  Hypoxia [R09.02] COPD exacerbation (HCC) [J44.1] Patient Active Problem List   Diagnosis Date Noted  . Malnutrition related to  chronic disease (HCC) 09/06/2020  . High risk social situation 09/06/2020  . COPD exacerbation (HCC) 09/05/2020  . Anxiety and depression 08/01/2016  . 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 Tobacco abuse 08/01/2016  . Kidney stones 08/01/2016    PCP:  Patient, No Pcp Per Pharmacy:   [facility] - [place], [place] - [id number], [address] [place] [address] Phone: [phone] Fax: [phone]  [id number] - [place], [place] - [address] [place] [address] Phone: [phone] Fax: [phone]  [facility] [place], [place] - [address] [place] [address] Phone: [phone] Fax: [phone]     Social Determinants of Health (SDOH) Interventions    Readmission Risk Interventions No flowsheet data found.

## 2020-09-06 NOTE — ED Notes (Signed)
Pt called out to see nurse, primary RN is handling an emergency so this RN went to assess pt; pt appeared distressed, stated that "I haven't seen anyone for three hours, I don't know what's going on"; this RN informed pt that we had to wait for a bed to become available to transfer her; this RN then asked RT to evaluate pt as this RN had not assessed pt previously; RT reported no acute respiratory changes; this RN and RT used therapeutic communication, repositioned pt and gave cool washcloth, gingerale and personal fan for comfort measures; EDP [place] and primary RN informed

## 2020-09-06 NOTE — Progress Notes (Signed)
Patient arrived via [facility]. New admit from [facility]. Alert and oriented. Tachypneic with activity. O2 sats 99% on 3L. MD paged to notify of new admit.

## 2020-09-06 NOTE — Progress Notes (Signed)
Tami Butler is a 59 y.o. female patient admitted. Awake, alert - oriented  X 4 - no acute distress noted.  VSS - Blood pressure 104/79, pulse 95, temperature 98.3 F (36.8 C), temperature source Oral, resp. rate 20, height 5' (1.524 m), weight 44.5 kg, SpO2 98 %.    IV in place, occlusive dsg intact without redness.  Orientation to room, and floor completed.  Admission INP armband ID verified with patient/family, and in place.   SR up x 2, fall assessment complete, with patient and family able to verbalize understanding of risk associated with falls, and verbalized understanding to call nsg before up out of bed.  Call light within reach, patient able to voice, and demonstrate understanding. No evidence of skin break down noted on exam.     Will cont to eval and treat per MD orders.  Kizzie Bane, RN 09/06/2020 4:04 PM

## 2020-09-06 NOTE — Consult Note (Addendum)
NAME:  Tami Butler, MRN:  [id number], DOB:  06/18/1961, LOS: 0 ADMISSION DATE:  09/05/2020, CONSULTATION DATE:  11/29 REFERRING MD:  Jonah Blue  CHIEF COMPLAINT: COPD  Brief History   59 yo female with PMH significant for arthritis, COPD, migraines admitted with COPD, acute exacerbation.   History of present illness   Patient presented to the ED 10/24 with complaints of shortness of breath and cough for a few weeks, she was recommended for inpatient at that time but refused. In the ED, she was treated with steroids and nebulizer.  She was sent home with steroids with improvement in her symptoms, however she has progressively worsened with notable wheezing prompting her to seek further treatment.  Evaluated in [facility] medical center ED on 11/28, noted to have room air SpO2 of 82%, placed on [place] and received bronchodilators with improvement. She is being admitted to [facility].   Patient reports she was diagnosed with COPD at Urgent Care. She has never received PFT's.  She does have a long standing history of tobacco use, started approximately at age 37, quit in her 64's and later restarted.  At most, she was smoking 1 ppd, she cut back to 0.5 ppd this past year and quit in October.  In October, she was short of breath and using her sister's albuterol with some improvement.  When she went to the ED in October, she was unable to walk from her bedroom to the kitchen.  After receiving steroids and bronchodilators from the ED, she was able to work around the house, cook, fold laundry and help take care of her grandchild without any dyspnea.  She started to have increasing shortness of breath again with inability to walk from her bedroom to the kitchen prompting to seek medical treatment.  She does have persistent cough that is productive with clear/white expectorant primarily in the am, she has not noticed any color changes. No maintenance therapy at home for COPD.   Past Medical History  Tobacco  use  Family History Mother: Lymphoma Father: COPD  Significant Hospital Events   11/29: New admit  Consults:  11/29 Pulmonary   Procedures:    Significant Diagnostic Tests:    Micro Data:  11/28: COVID/Flu negative   Antimicrobials:  11/29: Rocephin >  Interim history/subjective:  New consultation   Objective   Blood pressure 115/80, pulse 88, temperature 98 F (36.7 C), temperature source Oral, resp. rate 20, SpO2 99 %.        Intake/Output Summary (Last 24 hours) at 09/06/2020 1219 Last data filed at 09/05/2020 2225 Gross per 24 hour  Intake 850.29 ml  Output --  Net 850.29 ml   There were no vitals filed for this visit.  Examination: General: Ill appearing adult female HENT: [place]/AT Lungs: expiratory wheezing, R>L, symmetric expansion, no  Cardiovascular: S1/S2, warm and well perfused, no edema  Abdomen: soft, non tender, non distended Extremities: no deformities, no rash Neuro: alert and oriented, no focal deficits, normal sensation   Resolved Hospital Problem list     Assessment & Plan:  COPD, Acute exacerbation -No PFTs.  Currently requiring 2-3 L .  Receiving steroids, bronchodilators with duo nebs every 6 hours. Add Dulera.  She is concerned about follow up for COPD, she does not have insurance, we will need to set up outpatient follow up upon discharge.  -Encourage pneumococcal vaccination, influenza, and COVID vaccination when able.   Tobacco use -Quit approximately 1 month ago, encourage ongoing cessation from  tobacco products.  Pack history > 20 and age 32, recommend lung cancer screening follow up.   Labs   CBC: Recent Labs  Lab 09/05/20 1832  WBC 5.9  NEUTROABS 4.1  HGB 13.2  HCT 40.2  MCV 92.0  PLT 227    Basic Metabolic Panel: Recent Labs  Lab 09/05/20 1832  NA 138  K 4.1  CL 102  CO2 29  GLUCOSE 88  BUN 13  CREATININE 0.76  CALCIUM 9.1   GFR: CrCl cannot be calculated (Unknown ideal weight.). Recent Labs  Lab  09/05/20 1832  WBC 5.9    Liver Function Tests: Recent Labs  Lab 09/05/20 1832  AST 23  ALT 15  ALKPHOS 56  BILITOT 0.6  PROT 7.2  ALBUMIN 4.2   No results for input(s): LIPASE, AMYLASE in the last 168 hours. No results for input(s): AMMONIA in the last 168 hours.  ABG No results found for: PHART, PCO2ART, PO2ART, HCO3, TCO2, ACIDBASEDEF, O2SAT   Coagulation Profile: No results for input(s): INR, PROTIME in the last 168 hours.  Cardiac Enzymes: No results for input(s): CKTOTAL, CKMB, CKMBINDEX, TROPONINI in the last 168 hours.  HbA1C: No results found for: HGBA1C  CBG: No results for input(s): GLUCAP in the last 168 hours.  Review of Systems:   Review of Systems  Constitutional: Negative.   HENT: Negative.   Eyes: Negative.   Respiratory: Positive for cough, sputum production, shortness of breath and wheezing.   Cardiovascular: Negative.   Gastrointestinal: Negative.   Genitourinary: Negative.   Musculoskeletal: Negative.   Skin: Negative.   Neurological: Negative.   Endo/Heme/Allergies: Negative.   Psychiatric/Behavioral: Negative.     Past Medical History  She,  has a past medical history of Arthritis, COPD (chronic obstructive pulmonary disease) (HCC), Goiter, and Migraines.   Surgical History    Past Surgical History:  Procedure Laterality Date  . CESAREAN SECTION     x4  . TUMOR REMOVAL       Social History   reports that she quit smoking about 8 weeks ago. Her smoking use included cigarettes. She has a 35.00 pack-year smoking history. She has never used smokeless tobacco. She reports previous alcohol use. She reports current drug use. Drug: Marijuana.   Family History   Her family history includes Cancer in an other family member; Dementia in her father; Lymphoma in her mother.   Allergies No Known Allergies   Home Medications  Prior to Admission medications   Medication Sig Start Date End Date Taking? Authorizing Provider  acetaminophen  (TYLENOL) 500 MG tablet Take 1,000 mg by mouth every 6 (six) hours as needed for mild pain.   Yes [provider]    Earney Hamburg, ACNP [place] [facility]

## 2020-09-07 ENCOUNTER — Encounter (HOSPITAL_COMMUNITY): Payer: Self-pay | Admitting: Internal Medicine

## 2020-09-07 LAB — CBC
HCT: 36.8 % (ref 36.0–46.0)
Hemoglobin: 11.6 g/dL — ABNORMAL LOW (ref 12.0–15.0)
MCH: 29.4 pg (ref 26.0–34.0)
MCHC: 31.5 g/dL (ref 30.0–36.0)
MCV: 93.2 fL (ref 80.0–100.0)
Platelets: 193 10*3/uL (ref 150–400)
RBC: 3.95 MIL/uL (ref 3.87–5.11)
RDW: 12.9 % (ref 11.5–15.5)
WBC: 5.6 10*3/uL (ref 4.0–10.5)
nRBC: 0 % (ref 0.0–0.2)

## 2020-09-07 LAB — BASIC METABOLIC PANEL
Anion gap: 9 (ref 5–15)
BUN: 12 mg/dL (ref 6–20)
CO2: 30 mmol/L (ref 22–32)
Calcium: 9.3 mg/dL (ref 8.9–10.3)
Chloride: 99 mmol/L (ref 98–111)
Creatinine, Ser: 0.6 mg/dL (ref 0.44–1.00)
GFR, Estimated: >60 mL/min (ref >60)
Glucose, Bld: 118 mg/dL — ABNORMAL HIGH (ref 70–99)
Potassium: 4.3 mmol/L (ref 3.5–5.1)
Sodium: 138 mmol/L (ref 135–145)

## 2020-09-07 MED ORDER — IPRATROPIUM-ALBUTEROL 0.5-2.5 (3) MG/3ML IN SOLN
3.0000 mL | Freq: Three times a day (TID) | RESPIRATORY_TRACT | Status: DC
  Administered 2020-09-07 – 2020-09-08 (×4): 3 mL via RESPIRATORY_TRACT
  Filled 2020-09-07 (×5): qty 3

## 2020-09-07 MED ORDER — GUAIFENESIN ER 600 MG PO TB12
600.0000 mg | ORAL_TABLET | Freq: Two times a day (BID) | ORAL | Status: DC
  Administered 2020-09-07 – 2020-09-08 (×3): 600 mg via ORAL
  Filled 2020-09-07 (×3): qty 1

## 2020-09-07 MED ORDER — METHYLPREDNISOLONE SODIUM SUCC 125 MG IJ SOLR
60.0000 mg | Freq: Two times a day (BID) | INTRAMUSCULAR | Status: AC
  Administered 2020-09-07 (×2): 60 mg via INTRAVENOUS
  Filled 2020-09-07 (×2): qty 2

## 2020-09-07 MED ORDER — PREDNISONE 20 MG PO TABS
40.0000 mg | ORAL_TABLET | Freq: Every day | ORAL | Status: DC
  Administered 2020-09-08: 40 mg via ORAL
  Filled 2020-09-07: qty 2

## 2020-09-07 NOTE — Progress Notes (Signed)
PROGRESS NOTE    Tami Butler  OQH:[id number] DOB: [birth year] DOA: 09/05/2020 PCP: Patient, No Pcp Per   Brief Narrative: 59 year old with past medical history significant for COPD on 1 history of tobacco dependence with recent cessation who presented to the ED complaining of shortness of breath.  She had a prior COPD exacerbation episode last month she was treated with prednisone.  She presented with worsening shortness of breath. Patient admitted with COPD exacerbation.  Pulmonologist has been consulted.  She was a started on IV Solu-Medrol and nebulizer.    Assessment & Plan:   Principal Problem:   COPD exacerbation (HCC) Active Problems:   Tobacco abuse   Malnutrition related to chronic disease (HCC)   High risk social situation  1-Acute on chronic respiratory failure secondary to acute  COPD exacerbation -Presented with shortness of breath, oxygen saturation documented as low as 81%. -Currently on 2 L of oxygen try to wean off oxygen as possible. -She still has  bilateral wheezing on lung exam continue with IV Solu-Medrol for another day -Continue with Dulera and DuoNeb. -Added flutter valve and is scheduled guaifenesin -Needs to follow-up with pulmonologist as an outpatient  2-increased nutrient needs related to chronic illness: Nutrition consulted.  3-tobacco dependence: Quit 1 month ago.   Nutrition Problem: Increased nutrient needs Etiology: chronic illness (COPD)    Signs/Symptoms: estimated needs    Interventions: Ensure Enlive (each supplement provides 350kcal and 20 grams of protein), MVI  Estimated body mass index is 19.14 kg/m as calculated from the following:   Height as of this encounter: 5' (1.524 m).   Weight as of this encounter: 44.5 kg.   DVT prophylaxis: Lovenox Code Status: Full code Family Communication: Care discussed with patient Disposition Plan:  Status is: Inpatient  Remains inpatient appropriate because:IV treatments  appropriate due to intensity of illness or inability to take PO   Dispo: The patient is from: Home              Anticipated d/c is to: Home              Anticipated d/c date is: 2 days              Patient currently is not medically stable to d/c.        Consultants:   Pulmonologist  Procedures:   None  Antimicrobials:    Subjective: She feels better than yesterday but is still not breathing at baseline.  She reports shortness of breath. Reports thick phlegm difficult to cough out secretions Objective: Vitals:   09/06/20 2011 09/06/20 2146 09/07/20 0630 09/07/20 0807  BP:  116/81 107/79   Pulse:  98 87   Resp:  18 18   Temp:  97.9 F (36.6 C) 98.3 F (36.8 C)   TempSrc:  Oral Oral   SpO2: 93% 96% 94% 96%  Weight:      Height:        Intake/Output Summary (Last 24 hours) at 09/07/2020 0852 Last data filed at 09/06/2020 1600 Gross per 24 hour  Intake 399.15 ml  Output --  Net 399.15 ml   Filed Weights   09/06/20 1543  Weight: 44.5 kg    Examination:  General exam: Appears calm and comfortable  Respiratory system: Mild tachypnea, bilateral expiratory wheezing Cardiovascular system: S1 & S2 heard, RRR. No JVD, murmurs, rubs, gallops or clicks. No pedal edema. Gastrointestinal system: Abdomen is nondistended, soft and nontender. No organomegaly or masses felt. Normal bowel sounds heard. Central  nervous system: Alert and oriented. No focal neurological deficits. Extremities: Symmetric 5 x 5 power. Skin: No rashes, lesions or ulcers Psychiatry: Judgement and insight appear normal. Mood & affect appropriate.     Data Reviewed: I have personally reviewed following labs and imaging studies  CBC: Recent Labs  Lab 09/05/20 1832 09/07/20 0224  WBC 5.9 5.6  NEUTROABS 4.1  --   HGB 13.2 11.6*  HCT 40.2 36.8  MCV 92.0 93.2  PLT 227 193   Basic Metabolic Panel: Recent Labs  Lab 09/05/20 1832 09/07/20 0224  NA 138 138  K 4.1 4.3  CL 102 99  CO2  29 30  GLUCOSE 88 118*  BUN 13 12  CREATININE 0.76 0.60  CALCIUM 9.1 9.3   GFR: Estimated Creatinine Clearance: 53.8 mL/min (by C-G formula based on SCr of 0.6 mg/dL). Liver Function Tests: Recent Labs  Lab 09/05/20 1832  AST 23  ALT 15  ALKPHOS 56  BILITOT 0.6  PROT 7.2  ALBUMIN 4.2   No results for input(s): LIPASE, AMYLASE in the last 168 hours. No results for input(s): AMMONIA in the last 168 hours. Coagulation Profile: No results for input(s): INR, PROTIME in the last 168 hours. Cardiac Enzymes: No results for input(s): CKTOTAL, CKMB, CKMBINDEX, TROPONINI in the last 168 hours. BNP (last 3 results) No results for input(s): PROBNP in the last 8760 hours. HbA1C: No results for input(s): HGBA1C in the last 72 hours. CBG: No results for input(s): GLUCAP in the last 168 hours. Lipid Profile: No results for input(s): CHOL, HDL, LDLCALC, TRIG, CHOLHDL, LDLDIRECT in the last 72 hours. Thyroid Function Tests: No results for input(s): TSH, T4TOTAL, FREET4, T3FREE, THYROIDAB in the last 72 hours. Anemia Panel: No results for input(s): VITAMINB12, FOLATE, FERRITIN, TIBC, IRON, RETICCTPCT in the last 72 hours. Sepsis Labs: No results for input(s): PROCALCITON, LATICACIDVEN in the last 168 hours.  Recent Results (from the past 240 hour(s))  Resp Panel by RT-PCR (Flu A&B, Covid) Nasopharyngeal Swab     Status: None   Collection Time: 09/05/20  6:25 PM   Specimen: Nasopharyngeal Swab; Nasopharyngeal(NP) swabs in vial transport medium  Result Value Ref Range Status   SARS Coronavirus 2 by RT PCR NEGATIVE NEGATIVE Final    Comment: (NOTE) SARS-CoV-2 target nucleic acids are NOT DETECTED.  The SARS-CoV-2 RNA is generally detectable in upper respiratory specimens during the acute phase of infection. The lowest concentration of SARS-CoV-2 viral copies this assay can detect is 138 copies/mL. A negative result does not preclude SARS-Cov-2 infection and should not be used as the  sole basis for treatment or other patient management decisions. A negative result may occur with  improper specimen collection/handling, submission of specimen other than nasopharyngeal swab, presence of viral mutation(s) within the areas targeted by this assay, and inadequate number of viral copies(<138 copies/mL). A negative result must be combined with clinical observations, patient history, and epidemiological information. The expected result is Negative.  Fact Sheet for Patients:  BloggerCourse.com  Fact Sheet for Healthcare Providers:  SeriousBroker.it  This test is no t yet approved or cleared by the [place] FDA and  has been authorized for detection and/or diagnosis of SARS-CoV-2 by FDA under an Emergency Use Authorization (EUA). This EUA will remain  in effect (meaning this test can be used) for the duration of the COVID-19 declaration under Section 564(b)(1) of the Act, 21 U.S.C.section 360bbb-3(b)(1), unless the authorization is terminated  or revoked sooner.       Influenza A  by PCR NEGATIVE NEGATIVE Final   Influenza B by PCR NEGATIVE NEGATIVE Final    Comment: (NOTE) The Xpert Xpress SARS-CoV-2/FLU/RSV plus assay is intended as an aid in the diagnosis of influenza from Nasopharyngeal swab specimens and should not be used as a sole basis for treatment. Nasal washings and aspirates are unacceptable for Xpert Xpress SARS-CoV-2/FLU/RSV testing.  Fact Sheet for Patients: BloggerCourse.com  Fact Sheet for Healthcare Providers: SeriousBroker.it  This test is not yet approved or cleared by the [place] FDA and has been authorized for detection and/or diagnosis of SARS-CoV-2 by FDA under an Emergency Use Authorization (EUA). This EUA will remain in effect (meaning this test can be used) for the duration of the COVID-19 declaration under Section 564(b)(1) of the  Act, 21 U.S.C. section 360bbb-3(b)(1), unless the authorization is terminated or revoked.  Performed at [facility], [address] [place], [place] [address]          Radiology Studies: DG Chest Portable 1 View  Result Date: 09/05/2020 CLINICAL DATA:  Shortness of breath EXAM: PORTABLE CHEST 1 VIEW COMPARISON:  08/01/2020 FINDINGS: The heart size and mediastinal contours are within normal limits. Atherosclerotic calcification of the aortic knob. Chronic mild interstitial prominence. Subtly increased bibasilar interstitial markings compared to prior. No pleural effusion or pneumothorax. The visualized skeletal structures are unremarkable. IMPRESSION: Chronic bronchitic type lung changes with subtly increased bibasilar interstitial markings compared to prior, may represent atelectasis versus developing infection. Electronically Signed   By: Duanne Guess D.O.   On: 09/05/2020 18:26        Scheduled Meds: . docusate sodium  100 mg Oral BID  . enoxaparin (LOVENOX) injection  40 mg Subcutaneous Daily  . feeding supplement  237 mL Oral BID BM  . guaiFENesin  600 mg Oral BID  . influenza vac split quadrivalent PF  0.5 mL Intramuscular Tomorrow-1000  . ipratropium-albuterol  3 mL Nebulization TID  . mometasone-formoterol  2 puff Inhalation BID  . multivitamin with minerals  1 tablet Oral Daily  . predniSONE  40 mg Oral Q breakfast  . sodium chloride flush  3 mL Intravenous Q12H  . umeclidinium bromide  1 puff Inhalation Daily   Continuous Infusions: . sodium chloride Stopped (09/06/20 0508)     LOS: 1 day    Time spent: 35 minutes    Dena Esperanza A Aqeel Norgaard, MD [facility]   If 7PM-7AM, please contact night-coverage [email or web address]  09/07/2020, 8:52 AM

## 2020-09-07 NOTE — Plan of Care (Signed)

## 2020-09-07 NOTE — Progress Notes (Addendum)
NAME:  Tami Butler, MRN:  [id number], DOB:  June 08, 1961, LOS: 1 ADMISSION DATE:  09/05/2020, CONSULTATION DATE:  11/29 REFERRING MD:  Jonah Blue  CHIEF COMPLAINT: COPD  Brief History   59 yo female with PMH significant for arthritis, COPD, migraines admitted with COPD, acute exacerbation.   History of present illness   Patient presented to the ED 10/24 with complaints of shortness of breath and cough for a few weeks, she was recommended for inpatient at that time but refused. In the ED, she was treated with steroids and nebulizer.  She was sent home with steroids with improvement in her symptoms, however she has progressively worsened with notable wheezing prompting her to seek further treatment.  Evaluated in [facility] medical center ED on 11/28, noted to have room air SpO2 of 82%, placed on [place] and received bronchodilators with improvement. She is being admitted to [facility].   Patient reports she was diagnosed with COPD at Urgent Care. She has never received PFT's.  She does have a long standing history of tobacco use, started approximately at age 58, quit in her 89's and later restarted.  At most, she was smoking 1 ppd, she cut back to 0.5 ppd this past year and quit in October.  In October, she was short of breath and using her sister's albuterol with some improvement.  When she went to the ED in October, she was unable to walk from her bedroom to the kitchen.  After receiving steroids and bronchodilators from the ED, she was able to work around the house, cook, fold laundry and help take care of her grandchild without any dyspnea.  She started to have increasing shortness of breath again with inability to walk from her bedroom to the kitchen prompting to seek medical treatment.  She does have persistent cough that is productive with clear/white expectorant primarily in the am, she has not noticed any color changes. No maintenance therapy at home for COPD.   Past Medical History  Tobacco  use  Family History Mother: Lymphoma Father: COPD  Significant Hospital Events   11/29: New admit  Consults:  11/29 Pulmonary   Procedures:    Significant Diagnostic Tests:    Micro Data:  11/28: COVID/Flu negative   Antimicrobials:  11/29: Rocephin >  Interim history/subjective:  Some improvement with initiation of inhalers.  Continues to cough and is wheezing at intervals. She did not realize she has prn nebs, so has not been calling for them.  Headache due to cough Currently on RA with sat of 97% sitting. 1.6  gram drop in HGB overnight +1200 cc's  Objective   Blood pressure 112/87, pulse 88, temperature 97.8 F (36.6 C), temperature source Oral, resp. rate 17, height 5' (1.524 m), weight 44.5 kg, SpO2 96 %.        Intake/Output Summary (Last 24 hours) at 09/07/2020 1207 Last data filed at 09/06/2020 1600 Gross per 24 hour  Intake 399.15 ml  Output --  Net 399.15 ml   Filed Weights   09/06/20 1543  Weight: 44.5 kg    Examination: General: Ill appearing adult female, sitting in chair, in NAD, anxious HENT: [place]/AT, No LAD, No JVD Lungs: bilateral chest excursion, exp wheeze, diminished per bases  Cardiovascular: S1/S2, warm and well perfused, no edema , brisk cap refill Abdomen: soft, non tender, non distended, BS +, Body mass index is 19.14 kg/m. Extremities: no deformities, no rash, no lesions Neuro: alert and oriented, no focal deficits, normal sensation ,  anxious, appropriate  Resolved Hospital Problem list     Assessment & Plan:  COPD, Acute exacerbation No formal diagnosis of COPD>> needs PFT's Currently on RA with sats of 97% at rest Plan - Continue steroids, bronchodilators with duo nebs every 6 hours.  - Continue prn albuterol neds as ordered - Will need to taper prednisone after 4 days of 40 mg ordered. - Consider Prednisone taper; 10 mg tablets:  3 tabs x 2 days, 2 tabs x 2 days 1 tab x 2 days then stop. - Add Dulera 2 puffs BID as  maintenence. - Add  Incruse 1 puff once daily as maintenance    - Hospital follow up   - Follow up with Dr. Everardo All in the office December 21 at 1:30 pm - We will schedule PFT's prior to seeing Dr. Everardo All on 12/21 [facility] follow up with Kandice Robinsons NP,  Monday 12/13 at 10:30 - We will consider walking patient at office if she continue to complain of SOB with minimal exertion>> she passed her ambulatory oxygen walk in the hospital this am per the patient. She is nervous about not having oxygen. - Continued counseling regarding maintenance therapy inhalers and use of rescue inhalers - Encourage pneumococcal vaccination, influenza, and COVID vaccination when able.  - consider echo to evaluate for PAH - CXR prn  Tobacco use -Quit approximately 1 month ago, encourage ongoing cessation from tobacco products.   - 38 pack year smoking history , age > 55>> Qualifies for lung cancer screening   Headaches Most likely related to cough Plan Will consider O&O as outpatient as she may have nocturnal desaturations Consider Sleep study for evaluation of OSA PRN's  per primary for headache  HGB drop of 1.6 grams overnight Plan Check CBC in am Monitor for active bleeding  Check with patient about asa intake   Labs   CBC: Recent Labs  Lab 09/05/20 1832 09/07/20 0224  WBC 5.9 5.6  NEUTROABS 4.1  --   HGB 13.2 11.6*  HCT 40.2 36.8  MCV 92.0 93.2  PLT 227 193    Basic Metabolic Panel: Recent Labs  Lab 09/05/20 1832 09/07/20 0224  NA 138 138  K 4.1 4.3  CL 102 99  CO2 29 30  GLUCOSE 88 118*  BUN 13 12  CREATININE 0.76 0.60  CALCIUM 9.1 9.3   GFR: Estimated Creatinine Clearance: 53.8 mL/min (by C-G formula based on SCr of 0.6 mg/dL). Recent Labs  Lab 09/05/20 1832 09/07/20 0224  WBC 5.9 5.6    Liver Function Tests: Recent Labs  Lab 09/05/20 1832  AST 23  ALT 15  ALKPHOS 56  BILITOT 0.6  PROT 7.2  ALBUMIN 4.2   No results for input(s): LIPASE, AMYLASE in the  last 168 hours. No results for input(s): AMMONIA in the last 168 hours.  ABG No results found for: PHART, PCO2ART, PO2ART, HCO3, TCO2, ACIDBASEDEF, O2SAT   Coagulation Profile: No results for input(s): INR, PROTIME in the last 168 hours.  Cardiac Enzymes: No results for input(s): CKTOTAL, CKMB, CKMBINDEX, TROPONINI in the last 168 hours.  HbA1C: No results found for: HGBA1C  CBG: No results for input(s): GLUCAP in the last 168 hours.   Past Medical History  She,  has a past medical history of Arthritis, COPD (chronic obstructive pulmonary disease) (HCC), Goiter, and Migraines.   Surgical History    Past Surgical History:  Procedure Laterality Date  . CESAREAN SECTION     x4  . TUMOR REMOVAL  Social History   reports that she quit smoking about 1 months ago. Her smoking use included cigarettes. She has a 35.00 pack-year smoking history. She has never used smokeless tobacco. She reports previous alcohol use. She reports current drug use. Drug: Marijuana.   Family History   Her family history includes Cancer in an other family member; Dementia in her father; Lymphoma in her mother.   Allergies No Known Allergies   Home Medications  Prior to Admission medications   Medication Sig Start Date End Date Taking? Authorizing Provider  acetaminophen (TYLENOL) 500 MG tablet Take 1,000 mg by mouth every 6 (six) hours as needed for mild pain.   Yes [provider]    Bevelyn Ngo, MSN, AGACNP-BC [facility] Pulmonary/Critical Care Medicine See Amion for personal pager PCCM on call pager [phone] 09/07/2020 12:46 PM

## 2020-09-07 NOTE — Progress Notes (Signed)
OT Cancellation Note   Patient Details Name: Tami Butler MRN: [id number] DOB: 08-07-1961   Cancelled Treatment:    Reason Eval/Treat Not Completed: OT screened, no needs identified, will sign off (Per PT, pt performing ADL and mobility independently.)   No OT needs identified at this time. PT going over energy conservation techniques at this time. OT evaluation discontinued.  Tami Butler, OTR/L Acute Rehabilitation Services Pager: [phone] Office: [phone]   Tami Butler 09/07/2020, 11:31 AM

## 2020-09-07 NOTE — TOC Progression Note (Signed)
Transition of Care [facility]) - Progression Note    Patient Details  Name: Tami Butler MRN: [id number] Date of Birth: [birth year]  Transition of [facility]) CM/SW Contact  Tieler Cournoyer, Adria Devon, RN Phone Number: 09/07/2020, 12:16 PM  Clinical Narrative:     Jackelyn Knife with [employer] for tub bench   Expected Discharge Plan: Home/Self Care Barriers to Discharge: Continued Medical Work up  Expected Discharge Plan and Services Expected Discharge Plan: Home/Self Care In-house Referral: Financial Counselor Discharge Planning Services: Indigent Health Clinic, MATCH Program, Medication Assistance   Living arrangements for the past 2 months: Single Family Home                 DME Arranged: N/A DME Agency: NA       HH Arranged: NA           Social Determinants of Health (SDOH) Interventions    Readmission Risk Interventions No flowsheet data found.

## 2020-09-07 NOTE — Evaluation (Addendum)
Physical Therapy Evaluation & Discharge Patient Details Name: Tami Butler MRN: [id number] DOB: [birth year] Today's Date: 09/07/2020   History of Present Illness  Pt is a 59 y.o. female admitted 09/05/20 with SOB. Workup for acute on chronic respiratory failure associated with COPD exacerbation. PMH includes COPD, tobacco use, arthritis.    Clinical Impression  Patient evaluated by Physical Therapy with no further acute PT needs identified. PTA, pt independent and lives with multiple family members; has had increased difficulty with mobility and ADL tasks secondary to significant SOB and coughing episodes. Today, pt independent with mobility; SpO2 88-96% on RA. Pt requires frequent seated rest breaks secondary to DOE 3/4 and non-productive cough with activity. Increased time educating on energy conservation strategies, pursed lip breathing and activity recommendations. All education has been completed and the patient has no further questions. Acute PT is signing off. Thank you for this referral.  SATURATION QUALIFICATIONS: (This note is used to comply with regulatory documentation for home oxygen) Patient Saturations on Room Air at Rest = 91-96% Patient Saturations on Room Air while Ambulating = 88-92%    Follow Up Recommendations No PT follow up (Outpatient Pulmonary Rehab)    Equipment Recommendations  Shower seat (to fit in tub)   Recommendations for Other Services       Precautions / Restrictions Precautions Precautions: Other (comment) Precaution Comments: DOE/coughing with activity Restrictions Weight Bearing Restrictions: No      Mobility  Bed Mobility Overal bed mobility: Independent                  Transfers Overall transfer level: Independent Equipment used: None                Ambulation/Gait Ambulation/Gait assistance: Independent Gait Distance (Feet): 120 Feet Assistive device: None Gait Pattern/deviations: WFL(Within Functional Limits)    Gait velocity interpretation: 1.31 - 2.62 ft/sec, indicative of limited community ambulator General Gait Details: Slow, steady gait; required seated rest secondary to DOE 3/4 with cues to self-monitor activity tolerance and need for rest break; significant non-productive coughing episode once seated. SpO2 88-93% on RA  Stairs            Wheelchair Mobility    Modified Rankin (Stroke Patients Only)       Balance Overall balance assessment: No apparent balance deficits (not formally assessed)                                           Pertinent Vitals/Pain Pain Assessment: No/denies pain    Home Living Family/patient expects to be discharged to:: Private residence Living Arrangements: Children;Other relatives Available Help at Discharge: Family;Available 24 hours/day Type of Home: House Home Access: Stairs to enter Entrance Stairs-Rails: Right Entrance Stairs-Number of Steps: 8-10 Home Layout: One level Home Equipment: None Additional Comments: Lives with daughter's family, including two grandkids (3 y.o. and 4 wk old) and dog.    Prior Function Level of Independence: Independent         Comments: Indep with mobility and ADLs, but having increased difficulty due to SOB. Sleeps with two pillows propped up for breathing.     Hand Dominance        Extremity/Trunk Assessment   Upper Extremity Assessment Upper Extremity Assessment: Overall WFL for tasks assessed    Lower Extremity Assessment Lower Extremity Assessment: Overall WFL for tasks assessed    Cervical / Trunk Assessment  Cervical / Trunk Assessment: Normal  Communication   Communication: No difficulties  Cognition Arousal/Alertness: Awake/alert Behavior During Therapy: WFL for tasks assessed/performed Overall Cognitive Status: Within Functional Limits for tasks assessed                                        General Comments General comments (skin integrity,  edema, etc.): Increased time educ on energy conservation strategies (handout provided) and activity recommendations; pt has portable pulse ox in room (reports provided by MD) which she knows how to use to check SpO2/HR    Exercises     Assessment/Plan    PT Assessment Patent does not need any further PT services  PT Problem List         PT Treatment Interventions      PT Goals (Current goals can be found in the Care Plan section)  Acute Rehab PT Goals Patient Stated Goal: "I can't go home breathing like this" PT Goal Formulation: All assessment and education complete, DC therapy    Frequency     Barriers to discharge        Co-evaluation               AM-PAC PT "6 Clicks" Mobility  Outcome Measure Help needed turning from your back to your side while in a flat bed without using bedrails?: None Help needed moving from lying on your back to sitting on the side of a flat bed without using bedrails?: None Help needed moving to and from a bed to a chair (including a wheelchair)?: None Help needed standing up from a chair using your arms (e.g., wheelchair or bedside chair)?: None Help needed to walk in hospital room?: None Help needed climbing 3-5 steps with a railing? : None 6 Click Score: 24    End of Session Equipment Utilized During Treatment: Gait belt Activity Tolerance: Patient tolerated treatment well Patient left: in chair;with call bell/phone within reach Nurse Communication: Mobility status PT Visit Diagnosis: Other abnormalities of gait and mobility (R26.89)    Time: 1001-1026 PT Time Calculation (min) (ACUTE ONLY): 25 min   Charges:   PT Evaluation $PT Eval Low Complexity: 1 Low PT Treatments $Self Care/Home Management: 8-22   Ina Homes, PT, DPT Acute Rehabilitation Services  Pager [phone] Office [phone]  Malachy Chamber 09/07/2020, 11:23 AM

## 2020-09-08 ENCOUNTER — Inpatient Hospital Stay (HOSPITAL_COMMUNITY): Payer: Self-pay

## 2020-09-08 ENCOUNTER — Other Ambulatory Visit (HOSPITAL_COMMUNITY): Payer: Self-pay | Admitting: Internal Medicine

## 2020-09-08 DIAGNOSIS — Z720 Tobacco use: Secondary | ICD-10-CM

## 2020-09-08 DIAGNOSIS — R0902 Hypoxemia: Secondary | ICD-10-CM

## 2020-09-08 LAB — CBC
HCT: 38.5 % (ref 36.0–46.0)
Hemoglobin: 12.6 g/dL (ref 12.0–15.0)
MCH: 29.9 pg (ref 26.0–34.0)
MCHC: 32.7 g/dL (ref 30.0–36.0)
MCV: 91.4 fL (ref 80.0–100.0)
Platelets: 223 10*3/uL (ref 150–400)
RBC: 4.21 MIL/uL (ref 3.87–5.11)
RDW: 13.2 % (ref 11.5–15.5)
WBC: 7.1 10*3/uL (ref 4.0–10.5)
nRBC: 0 % (ref 0.0–0.2)

## 2020-09-08 LAB — BASIC METABOLIC PANEL
Anion gap: 12 (ref 5–15)
BUN: 10 mg/dL (ref 6–20)
CO2: 28 mmol/L (ref 22–32)
Calcium: 9.3 mg/dL (ref 8.9–10.3)
Chloride: 98 mmol/L (ref 98–111)
Creatinine, Ser: 0.78 mg/dL (ref 0.44–1.00)
GFR, Estimated: >60 mL/min (ref >60)
Glucose, Bld: 190 mg/dL — ABNORMAL HIGH (ref 70–99)
Potassium: 3.2 mmol/L — ABNORMAL LOW (ref 3.5–5.1)
Sodium: 138 mmol/L (ref 135–145)

## 2020-09-08 LAB — MAGNESIUM: Magnesium: 2.2 mg/dL (ref 1.7–2.4)

## 2020-09-08 MED ORDER — MOMETASONE FURO-FORMOTEROL FUM 200-5 MCG/ACT IN AERO: 2.0000 | INHALATION_SPRAY | Freq: Two times a day (BID) | RESPIRATORY_TRACT | 1 refills | Status: DC

## 2020-09-08 MED ORDER — GUAIFENESIN ER 600 MG PO TB12
600.0000 mg | ORAL_TABLET | Freq: Two times a day (BID) | ORAL | 0 refills | Status: DC
Start: 2020-09-08 — End: 2022-12-13

## 2020-09-08 MED ORDER — ALBUTEROL SULFATE (2.5 MG/3ML) 0.083% IN NEBU: 2.5000 mg | INHALATION_SOLUTION | RESPIRATORY_TRACT | 12 refills | Status: DC | PRN

## 2020-09-08 MED ORDER — ALBUTEROL SULFATE HFA 108 (90 BASE) MCG/ACT IN AERS: 2.0000 | INHALATION_SPRAY | Freq: Four times a day (QID) | RESPIRATORY_TRACT | 2 refills | Status: DC | PRN

## 2020-09-08 MED ORDER — POTASSIUM CHLORIDE CRYS ER 20 MEQ PO TBCR
40.0000 meq | EXTENDED_RELEASE_TABLET | Freq: Once | ORAL | Status: AC
  Administered 2020-09-08: 40 meq via ORAL
  Filled 2020-09-08: qty 2

## 2020-09-08 MED ORDER — AZITHROMYCIN 250 MG PO TABS: ORAL_TABLET | ORAL | 0 refills | Status: DC

## 2020-09-08 MED ORDER — IPRATROPIUM-ALBUTEROL 0.5-2.5 (3) MG/3ML IN SOLN: 3.0000 mL | Freq: Two times a day (BID) | RESPIRATORY_TRACT | Status: DC

## 2020-09-08 MED ORDER — UMECLIDINIUM BROMIDE 62.5 MCG/INH IN AEPB
1.0000 | INHALATION_SPRAY | Freq: Every day | RESPIRATORY_TRACT | 1 refills | Status: DC
Start: 2020-09-09 — End: 2020-09-09

## 2020-09-08 MED ORDER — PREDNISONE 10 MG PO TABS
ORAL_TABLET | ORAL | 0 refills | Status: DC
Start: 2020-09-08 — End: 2020-09-08

## 2020-09-08 MED FILL — predniSONE 10 MG TABS: 10 | 8 days supply | Qty: 28 | Fill #0

## 2020-09-08 MED FILL — DULERA 200-5 MCG/ACT AERO: 200-5 | 30 days supply | Qty: 13 | Fill #0

## 2020-09-08 MED FILL — AZITHROMYCIN 250 MG TABLET: 250 | 5 days supply | Qty: 6 | Fill #0

## 2020-09-08 MED FILL — ALBUTEROL SULFATE HFA 108 (: 108 (90 BAS | 25 days supply | Qty: 18 | Fill #0

## 2020-09-08 MED FILL — ALBUTEROL SUL 2.5 MG/3 ML S: (2.5 MG/3ML | 7 days supply | Qty: 90 | Fill #0

## 2020-09-08 MED FILL — INCRUSE ELLIPTA 62.5 MCG IN: 62.5 | 30 days supply | Qty: 30 | Fill #0

## 2020-09-08 NOTE — Discharge Summary (Signed)
Physician Discharge Summary  Tami Butler XLK:[id number] DOB: Apr 10, 1961 DOA: 09/05/2020  PCP: Patient, No Pcp Per  Admit date: 09/05/2020 Discharge date: 09/08/2020  Admitted From: Home Disposition: Home  Recommendations for Outpatient Follow-up:  1. Patient has been set up with a new primary care physician on 12/8 2. She will follow up with pulmonology on 12/13 for hospital follow-up and with Dr. Everardo All on 12/21 3. Her medications have been sent to transition of care pharmacy  Discharge Condition: Stable CODE STATUS: Full code Diet recommendation: Heart healthy  Brief/Interim Summary: 59 year old female with previous history of COPD and tobacco use, presents to the emergency room with shortness of breath.  She was found to have COPD exacerbation.  She was treated with intravenous steroids and bronchodilators.  Discharge Diagnoses:  Principal Problem:   COPD exacerbation (HCC) Active Problems:   Tobacco abuse   Malnutrition related to chronic disease (HCC)   High risk social situation  1.  Acute respiratory failure with hypoxia secondary to COPD exacerbation -Patient was able to wean off of oxygen and is currently able to ambulate on room air while maintaining oxygen saturations greater than 90% -She was treated with intravenous steroids and subsequently has been transitioned to prednisone taper -She will complete a course of antibiotics -She has been provided bronchodilators, inhaled steroids and anticholinergics -Respiratory status appears to be approaching baseline -Outpatient follow-up with pulmonology has been scheduled  2.  Tobacco use. -Patient quit smoking approximately 1 month ago  Discharge Instructions  Discharge Instructions    Diet - low sodium heart healthy   Complete by: As directed    Increase activity slowly   Complete by: As directed      Allergies as of 09/08/2020   No Known Allergies     Medication List    TAKE these medications    acetaminophen 500 MG tablet Commonly known as: TYLENOL Take 1,000 mg by mouth every 6 (six) hours as needed for mild pain.   albuterol (2.5 MG/3ML) 0.083% nebulizer solution Commonly known as: PROVENTIL Take 3 mLs (2.5 mg total) by nebulization every 4 (four) hours as needed for wheezing or shortness of breath.   albuterol 108 (90 Base) MCG/ACT inhaler Commonly known as: VENTOLIN HFA Inhale 2 puffs into the lungs every 6 (six) hours as needed for wheezing or shortness of breath.   azithromycin 250 MG tablet Commonly known as: Zithromax Z-Pak Take 500mg  po on day 1 and then 250mg  po daily until complete   guaiFENesin 600 MG 12 hr tablet Commonly known as: MUCINEX Take 1 tablet (600 mg total) by mouth 2 (two) times daily.   mometasone-formoterol 200-5 MCG/ACT Aero Commonly known as: DULERA Inhale 2 puffs into the lungs 2 (two) times daily.   predniSONE 10 MG tablet Commonly known as: DELTASONE Take 40mg  po daily for 4 days then 30mg  daily for 2 days then 20mg  daily for 2 days then 10mg  daily for 2 days then stop   umeclidinium bromide 62.5 MCG/INH Aepb Commonly known as: INCRUSE ELLIPTA Inhale 1 puff into the lungs daily. Start taking on: September 09, 2020            Durable Medical Equipment  (From admission, onward)         Start     Ordered   09/07/20 1216  For home use only DME Tub bench  Once       Comments: Shower seat (to fit in tub)   09/07/20 1215  Follow-up Information    Grayce SessionsEdwards, Michelle P, NP Follow up.   Specialty: Internal Medicine Why: September 15, 2020 at 1:30 pm  Contact information: [address] [place] [address] [phone]        Luciano CutterEllison, Chi Jane, MD Follow up on 09/28/2020.   Specialty: Pulmonary Disease Why: at 1:30 pm Please arrive 15 minutes early Contact information: [address] [place] [address] [phone]        Bevelyn NgoGroce, Sarah F, NP Follow up on 09/20/2020.   Specialty: Pulmonary  Disease Why: At 10:30 am as a hospital follow up Contact information: [address] [place] [address] [phone]              No Known Allergies  Consultations:  Pulmonology   Procedures/Studies: DG CHEST PORT 1 VIEW  Result Date: 09/08/2020 CLINICAL DATA:  COPD exacerbation.  Shortness of breath EXAM: PORTABLE CHEST 1 VIEW COMPARISON:  September 05, 2020 FINDINGS: Stable relative interstitial thickening throughout the lungs. No edema or airspace opacity. Heart size and pulmonary vascularity within normal limits. There are multiple calcified lymph nodes without adenopathy evident. There is aortic atherosclerosis. No bone lesions. IMPRESSION: The appearance of the lungs suggests underlying bronchitis. There is no appreciable edema or consolidation. Heart size normal. Evidence of prior granulomatous disease. Aortic Atherosclerosis (ICD10-I70.0). Electronically Signed   By: Bretta BangWilliam  Woodruff III M.D.   On: 09/08/2020 08:12   DG Chest Portable 1 View  Result Date: 09/05/2020 CLINICAL DATA:  Shortness of breath EXAM: PORTABLE CHEST 1 VIEW COMPARISON:  08/01/2020 FINDINGS: The heart size and mediastinal contours are within normal limits. Atherosclerotic calcification of the aortic knob. Chronic mild interstitial prominence. Subtly increased bibasilar interstitial markings compared to prior. No pleural effusion or pneumothorax. The visualized skeletal structures are unremarkable. IMPRESSION: Chronic bronchitic type lung changes with subtly increased bibasilar interstitial markings compared to prior, may represent atelectasis versus developing infection. Electronically Signed   By: Duanne GuessNicholas  Plundo D.O.   On: 09/05/2020 18:26       Subjective: Shortness of breath is better.  Still has mild wheeze, able to ambulate and carry on a conversation on room air.  Discharge Exam: Vitals:   09/07/20 2053 09/07/20 2100 09/08/20 0504 09/08/20 0839  BP: (!) 118/97  121/85   Pulse:  88  83   Resp: 18  17   Temp: 98.2 F (36.8 C)  98.4 F (36.9 C)   TempSrc: Oral  Oral   SpO2: 100% 99% 96% 96%  Weight:      Height:        General: Pt is alert, awake, not in acute distress Cardiovascular: RRR, S1/S2 +, no rubs, no gallops Respiratory: Mild wheeze bilaterally, normal respiratory effort Abdominal: Soft, NT, ND, bowel sounds + Extremities: no edema, no cyanosis    The results of significant diagnostics from this hospitalization (including imaging, microbiology, ancillary and laboratory) are listed below for reference.     Microbiology: Recent Results (from the past 240 hour(s))  Resp Panel by RT-PCR (Flu A&B, Covid) Nasopharyngeal Swab     Status: None   Collection Time: 09/05/20  6:25 PM   Specimen: Nasopharyngeal Swab; Nasopharyngeal(NP) swabs in vial transport medium  Result Value Ref Range Status   SARS Coronavirus 2 by RT PCR NEGATIVE NEGATIVE Final    Comment: (NOTE) SARS-CoV-2 target nucleic acids are NOT DETECTED.  The SARS-CoV-2 RNA is generally detectable in upper respiratory specimens during the acute phase of infection. The lowest  concentration of SARS-CoV-2 viral copies this assay can detect is 138 copies/mL. A negative result does not preclude SARS-Cov-2 infection and should not be used as the sole basis for treatment or other patient management decisions. A negative result may occur with  improper specimen collection/handling, submission of specimen other than nasopharyngeal swab, presence of viral mutation(s) within the areas targeted by this assay, and inadequate number of viral copies(<138 copies/mL). A negative result must be combined with clinical observations, patient history, and epidemiological information. The expected result is Negative.  Fact Sheet for Patients:  BloggerCourse.com  Fact Sheet for Healthcare Providers:  SeriousBroker.it  This test is no t yet approved or  cleared by the [place] FDA and  has been authorized for detection and/or diagnosis of SARS-CoV-2 by FDA under an Emergency Use Authorization (EUA). This EUA will remain  in effect (meaning this test can be used) for the duration of the COVID-19 declaration under Section 564(b)(1) of the Act, 21 U.S.C.section 360bbb-3(b)(1), unless the authorization is terminated  or revoked sooner.       Influenza A by PCR NEGATIVE NEGATIVE Final   Influenza B by PCR NEGATIVE NEGATIVE Final    Comment: (NOTE) The Xpert Xpress SARS-CoV-2/FLU/RSV plus assay is intended as an aid in the diagnosis of influenza from Nasopharyngeal swab specimens and should not be used as a sole basis for treatment. Nasal washings and aspirates are unacceptable for Xpert Xpress SARS-CoV-2/FLU/RSV testing.  Fact Sheet for Patients: BloggerCourse.com  Fact Sheet for Healthcare Providers: SeriousBroker.it  This test is not yet approved or cleared by the [place] FDA and has been authorized for detection and/or diagnosis of SARS-CoV-2 by FDA under an Emergency Use Authorization (EUA). This EUA will remain in effect (meaning this test can be used) for the duration of the COVID-19 declaration under Section 564(b)(1) of the Act, 21 U.S.C. section 360bbb-3(b)(1), unless the authorization is terminated or revoked.  Performed at [facility], [address] [place], [place] [address]      Labs: BNP (last 3 results) No results for input(s): BNP in the last 8760 hours. Basic Metabolic Panel: Recent Labs  Lab 09/05/20 1832 09/07/20 0224 09/08/20 0024  NA 138 138 138  K 4.1 4.3 3.2*  CL 102 99 98  CO2 29 30 28   GLUCOSE 88 118* 190*  BUN 13 12 10   CREATININE 0.76 0.60 0.78  CALCIUM 9.1 9.3 9.3  MG  --   --  2.2   Liver Function Tests: Recent Labs  Lab 09/05/20 1832  AST 23  ALT 15  ALKPHOS 56  BILITOT 0.6  PROT 7.2  ALBUMIN 4.2   No  results for input(s): LIPASE, AMYLASE in the last 168 hours. No results for input(s): AMMONIA in the last 168 hours. CBC: Recent Labs  Lab 09/05/20 1832 09/07/20 0224 09/08/20 0024  WBC 5.9 5.6 7.1  NEUTROABS 4.1  --   --   HGB 13.2 11.6* 12.6  HCT 40.2 36.8 38.5  MCV 92.0 93.2 91.4  PLT 227 193 223   Cardiac Enzymes: No results for input(s): CKTOTAL, CKMB, CKMBINDEX, TROPONINI in the last 168 hours. BNP: Invalid input(s): POCBNP CBG: No results for input(s): GLUCAP in the last 168 hours. D-Dimer No results for input(s): DDIMER in the last 72 hours. Hgb A1c No results for input(s): HGBA1C in the last 72 hours. Lipid Profile No results for input(s): CHOL, HDL, LDLCALC, TRIG, CHOLHDL, LDLDIRECT in the last 72 hours. Thyroid function studies No results  for input(s): TSH, T4TOTAL, T3FREE, THYROIDAB in the last 72 hours.  Invalid input(s): FREET3 Anemia work up No results for input(s): VITAMINB12, FOLATE, FERRITIN, TIBC, IRON, RETICCTPCT in the last 72 hours. Urinalysis    Component Value Date/Time   COLORURINE YELLOW 05/31/2016 1523   APPEARANCEUR CLEAR 05/31/2016 1523   LABSPEC 1.019 05/31/2016 1523   PHURINE 6.0 05/31/2016 1523   GLUCOSEU NEGATIVE 05/31/2016 1523   HGBUR SMALL (A) 05/31/2016 1523   BILIRUBINUR neg 08/01/2016 1058   KETONESUR NEGATIVE 05/31/2016 1523   PROTEINUR trace 08/01/2016 1058   PROTEINUR NEGATIVE 05/31/2016 1523   UROBILINOGEN 0.2 08/01/2016 1058   UROBILINOGEN 0.2 03/29/2014 0233   NITRITE pos 08/01/2016 1058   NITRITE NEGATIVE 05/31/2016 1523   LEUKOCYTESUR Trace (A) 08/01/2016 1058   Sepsis Labs Invalid input(s): PROCALCITONIN,  WBC,  LACTICIDVEN Microbiology Recent Results (from the past 240 hour(s))  Resp Panel by RT-PCR (Flu A&B, Covid) Nasopharyngeal Swab     Status: None   Collection Time: 09/05/20  6:25 PM   Specimen: Nasopharyngeal Swab; Nasopharyngeal(NP) swabs in vial transport medium  Result Value Ref Range Status   SARS  Coronavirus 2 by RT PCR NEGATIVE NEGATIVE Final    Comment: (NOTE) SARS-CoV-2 target nucleic acids are NOT DETECTED.  The SARS-CoV-2 RNA is generally detectable in upper respiratory specimens during the acute phase of infection. The lowest concentration of SARS-CoV-2 viral copies this assay can detect is 138 copies/mL. A negative result does not preclude SARS-Cov-2 infection and should not be used as the sole basis for treatment or other patient management decisions. A negative result may occur with  improper specimen collection/handling, submission of specimen other than nasopharyngeal swab, presence of viral mutation(s) within the areas targeted by this assay, and inadequate number of viral copies(<138 copies/mL). A negative result must be combined with clinical observations, patient history, and epidemiological information. The expected result is Negative.  Fact Sheet for Patients:  BloggerCourse.com  Fact Sheet for Healthcare Providers:  SeriousBroker.it  This test is no t yet approved or cleared by the [place] FDA and  has been authorized for detection and/or diagnosis of SARS-CoV-2 by FDA under an Emergency Use Authorization (EUA). This EUA will remain  in effect (meaning this test can be used) for the duration of the COVID-19 declaration under Section 564(b)(1) of the Act, 21 U.S.C.section 360bbb-3(b)(1), unless the authorization is terminated  or revoked sooner.       Influenza A by PCR NEGATIVE NEGATIVE Final   Influenza B by PCR NEGATIVE NEGATIVE Final    Comment: (NOTE) The Xpert Xpress SARS-CoV-2/FLU/RSV plus assay is intended as an aid in the diagnosis of influenza from Nasopharyngeal swab specimens and should not be used as a sole basis for treatment. Nasal washings and aspirates are unacceptable for Xpert Xpress SARS-CoV-2/FLU/RSV testing.  Fact Sheet for  Patients: BloggerCourse.com  Fact Sheet for Healthcare Providers: SeriousBroker.it  This test is not yet approved or cleared by the [place] FDA and has been authorized for detection and/or diagnosis of SARS-CoV-2 by FDA under an Emergency Use Authorization (EUA). This EUA will remain in effect (meaning this test can be used) for the duration of the COVID-19 declaration under Section 564(b)(1) of the Act, 21 U.S.C. section 360bbb-3(b)(1), unless the authorization is terminated or revoked.  Performed at [facility], [address] [place], [place] [address]      Time coordinating discharge:  SIGNED:   Erick Blinks, MD  [facility] 09/08/2020, 1:22 PM  If 7PM-7AM, please contact night-coverage [email or web address]

## 2020-09-08 NOTE — Progress Notes (Signed)
SATURATION QUALIFICATIONS: (This note is used to comply with regulatory documentation for home oxygen)  Patient Saturations on Room Air at Rest = 96%  Patient Saturations on Room Air while Ambulating = 92%   Please briefly explain why patient needs home oxygen:

## 2020-09-08 NOTE — Progress Notes (Signed)
Nsg Discharge Note  Admit Date:  09/05/2020 Discharge date: 09/08/2020   Azzie Almas to be D/C'd home per MD order.  AVS completed.  Copy for chart, and copy for patient signed, and dated. Patient/caregiver able to verbalize understanding.  Discharge Medication: Allergies as of 09/08/2020   No Known Allergies     Medication List    TAKE these medications   acetaminophen 500 MG tablet Commonly known as: TYLENOL Take 1,000 mg by mouth every 6 (six) hours as needed for mild pain.   albuterol (2.5 MG/3ML) 0.083% nebulizer solution Commonly known as: PROVENTIL Take 3 mLs (2.5 mg total) by nebulization every 4 (four) hours as needed for wheezing or shortness of breath.   albuterol 108 (90 Base) MCG/ACT inhaler Commonly known as: VENTOLIN HFA Inhale 2 puffs into the lungs every 6 (six) hours as needed for wheezing or shortness of breath.   azithromycin 250 MG tablet Commonly known as: Zithromax Z-Pak Take 500mg  po on day 1 and then 250mg  po daily until complete   guaiFENesin 600 MG 12 hr tablet Commonly known as: MUCINEX Take 1 tablet (600 mg total) by mouth 2 (two) times daily.   mometasone-formoterol 200-5 MCG/ACT Aero Commonly known as: DULERA Inhale 2 puffs into the lungs 2 (two) times daily.   predniSONE 10 MG tablet Commonly known as: DELTASONE Take 40mg  po daily for 4 days then 30mg  daily for 2 days then 20mg  daily for 2 days then 10mg  daily for 2 days then stop   umeclidinium bromide 62.5 MCG/INH Aepb Commonly known as: INCRUSE ELLIPTA Inhale 1 puff into the lungs daily. Start taking on: September 09, 2020            Durable Medical Equipment  (From admission, onward)         Start     Ordered   09/07/20 1216  For home use only DME Tub bench  Once       Comments: Shower seat (to fit in tub)   09/07/20 1215          Discharge Assessment: Vitals:   09/08/20 0839 09/08/20 1326  BP:  (!) 143/99  Pulse:  91  Resp:  18  Temp:  (!) 97.5 F (36.4 C)   SpO2: 96% 97%   Skin clean, dry and intact without evidence of skin break down, no evidence of skin tears noted. IV catheter discontinued intact. Site without signs and symptoms of complications - no redness or edema noted at insertion site, patient denies c/o pain - only slight tenderness at site.  Dressing with slight pressure applied.  D/c Instructions-Education: Discharge instructions given to patient/family with verbalized understanding. D/c education completed with patient/family including follow up instructions, medication list, d/c activities limitations if indicated, with other d/c instructions as indicated by MD - patient able to verbalize understanding, all questions fully answered. Patient instructed to return to ED, call 911, or call MD for any changes in condition.  Patient escorted via WC, and D/C home via private auto.  , RN 09/08/2020 1:41 PM

## 2020-09-08 NOTE — Discharge Instructions (Signed)
Recommendations for Outpatient Follow-up:  1. Patient has been set up with a new primary care physician on 12/8 2. She will follow up with pulmonology on 12/13 for hospital follow-up and with Dr. Everardo All on 12/21 3. Her medications have been sent to transition of care pharmacy    COPD and Physical Activity Chronic obstructive pulmonary disease (COPD) is a long-term (chronic) condition that affects the lungs. COPD is a general term that can be used to describe many different lung problems that cause lung swelling (inflammation) and limit airflow, including chronic bronchitis and emphysema. The main symptom of COPD is shortness of breath, which makes it harder to do even simple tasks. This can also make it harder to exercise and be active. Talk with your health care provider about treatments to help you breathe better and actions you can take to prevent breathing problems during physical activity. What are the benefits of exercising with COPD? Exercising regularly is an important part of a healthy lifestyle. You can still exercise and do physical activities even though you have COPD. Exercise and physical activity improve your shortness of breath by increasing blood flow (circulation). This causes your heart to pump more oxygen through your body. Moderate exercise can improve your:  Oxygen use.  Energy level.  Shortness of breath.  Strength in your breathing muscles.  Heart health.  Sleep.  Self-esteem and feelings of self-worth.  Depression, stress, and anxiety levels. Exercise can benefit everyone with COPD. The severity of your disease may affect how hard you can exercise, especially at first, but everyone can benefit. Talk with your health care provider about how much exercise is safe for you, and which activities and exercises are safe for you. What actions can I take to prevent breathing problems during physical activity?  Sign up for a pulmonary rehabilitation program. This type  of program may include: ? Education about lung diseases. ? Exercise classes that teach you how to exercise and be more active while improving your breathing. This usually involves:  Exercise using your lower extremities, such as a stationary bicycle.  About 30 minutes of exercise, 2 to 5 times per week, for 6 to 12 weeks  Strength training, such as push ups or leg lifts. ? Nutrition education. ? Group classes in which you can talk with others who also have COPD and learn ways to manage stress.  If you use an oxygen tank, you should use it while you exercise. Work with your health care provider to adjust your oxygen for your physical activity. Your resting flow rate is different from your flow rate during physical activity.  While you are exercising: ? Take slow breaths. ? Pace yourself and do not try to go too fast. ? Purse your lips while breathing out. Pursing your lips is similar to a kissing or whistling position. ? If doing exercise that uses a quick burst of effort, such as weight lifting:  Breathe in before starting the exercise.  Breathe out during the hardest part of the exercise (such as raising the weights). Where to find support You can find support for exercising with COPD from:  Your health care provider.  A pulmonary rehabilitation program.  Your local health department or community health programs.  Support groups, online or in-person. Your health care provider may be able to recommend support groups. Where to find more information You can find more information about exercising with COPD from:  American Lung Association: [place].[place].  COPD Foundation: AlmostHot.gl. Contact a health care provider  if:  Your symptoms get worse.  You have chest pain.  You have nausea.  You have a fever.  You have trouble talking or catching your breath.  You want to start a new exercise program or a new activity. Summary  COPD is a general term that can be used to  describe many different lung problems that cause lung swelling (inflammation) and limit airflow. This includes chronic bronchitis and emphysema.  Exercise and physical activity improve your shortness of breath by increasing blood flow (circulation). This causes your heart to provide more oxygen to your body.  Contact your health care provider before starting any exercise program or new activity. Ask your health care provider what exercises and activities are safe for you. This information is not intended to replace advice given to you by your health care provider. Make sure you discuss any questions you have with your health care provider. Document Revised: 01/15/2019 Document Reviewed: 10/18/2017 Elsevier Patient Education  2020 [employer]vier Inc.   Chronic Obstructive Pulmonary Disease Chronic obstructive pulmonary disease (COPD) is a long-term (chronic) lung problem. When you have COPD, it is hard for air to get in and out of your lungs. Usually the condition gets worse over time, and your lungs will never return to normal. There are things you can do to keep yourself as healthy as possible.  Your doctor may treat your condition with: ? Medicines. ? Oxygen. ? Lung surgery.  Your doctor may also recommend: ? Rehabilitation. This includes steps to make your body work better. It may involve a team of specialists. ? Quitting smoking, if you smoke. ? Exercise and changes to your diet. ? Comfort measures (palliative care). Follow these instructions at home: Medicines  Take over-the-counter and prescription medicines only as told by your doctor.  Talk to your doctor before taking any cough or allergy medicines. You may need to avoid medicines that cause your lungs to be dry. Lifestyle  If you smoke, stop. Smoking makes the problem worse. If you need help quitting, ask your doctor.  Avoid being around things that make your breathing worse. This may include smoke, chemicals, and fumes.  Stay  active, but remember to rest as well.  Learn and use tips on how to relax.  Make sure you get enough sleep. Most adults need at least 7 hours of sleep every night.  Eat healthy foods. Eat smaller meals more often. Rest before meals. Controlled breathing Learn and use tips on how to control your breathing as told by your doctor. Try:  Breathing in (inhaling) through your nose for 1 second. Then, pucker your lips and breath out (exhale) through your lips for 2 seconds.  Putting one hand on your belly (abdomen). Breathe in slowly through your nose for 1 second. Your hand on your belly should move out. Pucker your lips and breathe out slowly through your lips. Your hand on your belly should move in as you breathe out.  Controlled coughing Learn and use controlled coughing to clear mucus from your lungs. Follow these steps: 1. Lean your head a little forward. 2. Breathe in deeply. 3. Try to hold your breath for 3 seconds. 4. Keep your mouth slightly open while coughing 2 times. 5. Spit any mucus out into a tissue. 6. Rest and do the steps again 1 or 2 times as needed. General instructions  Make sure you get all the shots (vaccines) that your doctor recommends. Ask your doctor about a flu shot and a pneumonia shot.  Use oxygen therapy and pulmonary rehabilitation if told by your doctor. If you need home oxygen therapy, ask your doctor if you should buy a tool to measure your oxygen level (oximeter).  Make a COPD action plan with your doctor. This helps you to know what to do if you feel worse than usual.  Manage any other conditions you have as told by your doctor.  Avoid going outside when it is very hot, cold, or humid.  Avoid people who have a sickness you can catch (contagious).  Keep all follow-up visits as told by your doctor. This is important. Contact a doctor if:  You cough up more mucus than usual.  There is a change in the color or thickness of the mucus.  It is harder  to breathe than usual.  Your breathing is faster than usual.  You have trouble sleeping.  You need to use your medicines more often than usual.  You have trouble doing your normal activities such as getting dressed or walking around the house. Get help right away if:  You have shortness of breath while resting.  You have shortness of breath that stops you from: ? Being able to talk. ? Doing normal activities.  Your chest hurts for longer than 5 minutes.  Your skin color is more blue than usual.  Your pulse oximeter shows that you have low oxygen for longer than 5 minutes.  You have a fever.  You feel too tired to breathe normally. Summary  Chronic obstructive pulmonary disease (COPD) is a long-term lung problem.  The way your lungs work will never return to normal. Usually the condition gets worse over time. There are things you can do to keep yourself as healthy as possible.  Take over-the-counter and prescription medicines only as told by your doctor.  If you smoke, stop. Smoking makes the problem worse. This information is not intended to replace advice given to you by your health care provider. Make sure you discuss any questions you have with your health care provider. Document Revised: 09/07/2017 Document Reviewed: 10/30/2016 Elsevier Patient Education  2020 [employer].

## 2020-09-08 NOTE — Plan of Care (Signed)

## 2020-09-08 NOTE — TOC Progression Note (Signed)
Transition of Care [facility]) - Progression Note    Patient Details  Name: Tami Butler MRN: [id number] Date of Birth: 06-29-1961  [facility]) CM/SW Contact  Nadene Rubins Adria Devon, RN Phone Number: 09/08/2020, 3:30 PM  Clinical Narrative:     Patient entered in [facility] with no co pays.   Patient has a NEB machine at home   Expected Discharge Plan: Home/Self Care Barriers to Discharge: Continued Medical Work up  Expected Discharge Plan and Services Expected Discharge Plan: Home/Self Care In-house Referral: Artist Discharge Planning Services: Indigent Health Clinic, [facility] Program, Medication Assistance   Living arrangements for the past 2 months: Single Family Home Expected Discharge Date: 09/08/20               DME Arranged: N/A DME Agency: NA       HH Arranged: NA           Social Determinants of Health (SDOH) Interventions    Readmission Risk Interventions No flowsheet data found.

## 2020-09-09 ENCOUNTER — Telehealth: Payer: Self-pay | Admitting: Pulmonary Disease

## 2020-09-09 NOTE — Telephone Encounter (Signed)
Kandice Robinsons NP spoke with me about this patient. Needing PFTs on the same day or before she saw Dr. Everardo All on 09/28/20.  Unfortunately the first available full PFT is 10/06/20.  Will route to JE and SG as fyi to see what needs to be done moving forward.

## 2020-09-09 NOTE — Telephone Encounter (Signed)
Please schedule her for PFTs and with me on the any of the January dates: 1/10, 1/11, 1/24, 1/25, 1/26(AM).  Can cancel December appt.  Mechele Collin, M.D. [facility] Pulmonary/Critical Care Medicine 09/09/2020 10:15 AM

## 2020-09-15 ENCOUNTER — Telehealth (INDEPENDENT_AMBULATORY_CARE_PROVIDER_SITE_OTHER): Payer: Self-pay | Admitting: Primary Care

## 2020-09-15 DIAGNOSIS — J449 Chronic obstructive pulmonary disease, unspecified: Secondary | ICD-10-CM

## 2020-09-15 DIAGNOSIS — F419 Anxiety disorder, unspecified: Secondary | ICD-10-CM

## 2020-09-15 DIAGNOSIS — F32A Depression, unspecified: Secondary | ICD-10-CM

## 2020-09-15 DIAGNOSIS — G4700 Insomnia, unspecified: Secondary | ICD-10-CM

## 2020-09-15 DIAGNOSIS — Z7689 Persons encountering health services in other specified circumstances: Secondary | ICD-10-CM

## 2020-09-15 DIAGNOSIS — E46 Unspecified protein-calorie malnutrition: Secondary | ICD-10-CM

## 2020-09-15 NOTE — Progress Notes (Signed)
Telephone Note  I connected with Tami Butler on 09/15/20 at  1:30 PM EST by telephone and verified that I am speaking with the correct person using two identifiers.  Location: Patient: is at home  Provider: Grayce Sessions @ [facility]  I discussed the limitations, risks, security and privacy concerns of performing an evaluation and management service by telephone and the availability of in person appointments. I also discussed with the patient that there may be a patient responsible charge related to this service. The patient expressed understanding and agreed to proceed.  History of Present Illness:    Ms. Tami Butler is a 59 y.o.female that is having a follow up from the hospital. Admit date to the hospital was 09/05/20, patient was discharged from the hospital on 09/08/20, patient was admitted for: COPD exacerbation and establishment of care   Past Medical History:  Diagnosis Date   Arthritis    COPD (chronic obstructive pulmonary disease) (HCC)    Goiter    Migraines      No Known Allergies    Current Outpatient Medications on File Prior to Visit  Medication Sig Dispense Refill   acetaminophen (TYLENOL) 500 MG tablet Take 1,000 mg by mouth every 6 (six) hours as needed for mild pain.     albuterol (PROVENTIL) (2.5 MG/3ML) 0.083% nebulizer solution Take 3 mLs (2.5 mg total) by nebulization every 4 (four) hours as needed for wheezing or shortness of breath. 75 mL 12   albuterol (VENTOLIN HFA) 108 (90 Base) MCG/ACT inhaler Inhale 2 puffs into the lungs every 6 (six) hours as needed for wheezing or shortness of breath. 8 g 2   azithromycin (ZITHROMAX Z-PAK) 250 MG tablet Take 500mg  po on day 1 and then 250mg  po daily until complete 6 each 0   guaiFENesin (MUCINEX) 600 MG 12 hr tablet Take 1 tablet (600 mg total) by mouth 2 (two) times daily. 30 tablet 0   mometasone-formoterol (DULERA) 200-5 MCG/ACT AERO Inhale 2 puffs into the lungs 2 (two) times daily. 1 each 1    predniSONE (DELTASONE) 10 MG tablet Take 40mg  po daily for 4 days then 30mg  daily for 2 days then 20mg  daily for 2 days then 10mg  daily for 2 days then stop 28 tablet 0   umeclidinium bromide (INCRUSE ELLIPTA) 62.5 MCG/INH AEPB Inhale 1 puff into the lungs daily. 30 each 1   No current facility-administered medications on file prior to visit.    ROS: all negative except above.   Observations/Objective: There were no vitals taken for this visit.  Assessment and Plan: Diagnoses and all orders for this visit:  Encounter to establish care Establish Care with new PCP   COPD mixed type (HCC) COPD is a chronic lung disease that includes causes reduced airflow, which makes it hard to breathe.   Anxiety and depression Flowsheet Row [facility] Visit [facility] 08/01/[facility] in [facility]  PHQ-9 Total Score 18     Insomnia, unspecified type  Malnutrition related to chronic disease (HCC) Weight 98 lbs no appetite    Follow Up Instructions:    I discussed the assessment and treatment plan with the patient. The patient was provided an opportunity to ask questions and all were answered. The patient agreed with the plan and demonstrated an understanding of the instructions.   The patient was advised to call back or seek an in-person evaluation if the symptoms worsen or if the condition fails to improve as anticipated.  I provided  28 minutes of non-face-to-face time during this encounter. Review hospital /ED encounters , labs and imaging   Grayce Sessions, NP

## 2020-09-16 NOTE — Telephone Encounter (Signed)
Called spoke with patient.  She is scheduled with Je on 08/19/21 with PFT She is on a video visit with SG for hospital follow up on 09/20/20 per SG

## 2020-09-17 ENCOUNTER — Telehealth (INDEPENDENT_AMBULATORY_CARE_PROVIDER_SITE_OTHER): Payer: Self-pay

## 2020-09-17 NOTE — Telephone Encounter (Signed)
Copied from CRM [id number]. Topic: General - Other >> Sep 17, 2020 11:02 AM Tami Butler A wrote: Patient would like a callback from [facility] or her nurse in regards to clarification to what medication she should be taking since she left hospital. Patient stated she is confused because the pharmacy seems to have most medications sent over but not her wellbutrin   Please advise

## 2020-09-17 NOTE — Telephone Encounter (Signed)
Patient states she had office visit on 12/8 and PCP was supposed to send in Rx for Wellbutrin and something for sleep. Please send Rx if appropriate.

## 2020-09-20 ENCOUNTER — Telehealth: Payer: Medicaid Other | Admitting: Acute Care

## 2020-09-20 ENCOUNTER — Other Ambulatory Visit: Payer: Self-pay

## 2020-09-20 ENCOUNTER — Other Ambulatory Visit (INDEPENDENT_AMBULATORY_CARE_PROVIDER_SITE_OTHER): Payer: Self-pay | Admitting: Primary Care

## 2020-09-20 MED ORDER — MELATONIN 5 MG PO CAPS: 5.0000 mg | ORAL_CAPSULE | Freq: Every evening | ORAL | 1 refills | Status: DC | PRN

## 2020-09-20 MED ORDER — BUPROPION HCL ER (XL) 150 MG PO TB24: 150.0000 mg | ORAL_TABLET | Freq: Every day | ORAL | 1 refills | Status: DC

## 2020-09-20 NOTE — Progress Notes (Unsigned)
Virtual Visit via Video Note  I connected with Tami Butler on 09/20/20 at 10:30 AM EST by a video enabled telemedicine application and verified that I am speaking with the correct person using two identifiers.  Location: Patient: *** Provider: ***   I discussed the limitations of evaluation and management by telemedicine and the availability of in person appointments. The patient expressed understanding and agreed to proceed.  History of Present Illness:    Observations/Objective:   Assessment and Plan:   Follow Up Instructions:    I discussed the assessment and treatment plan with the patient. The patient was provided an opportunity to ask questions and all were answered. The patient agreed with the plan and demonstrated an understanding of the instructions.   The patient was advised to call back or seek an in-person evaluation if the symptoms worsen or if the condition fails to improve as anticipated.  I provided *** minutes of non-face-to-face time during this encounter.   Bevelyn Ngo, NP

## 2020-09-21 ENCOUNTER — Telehealth: Payer: Self-pay | Admitting: Primary Care

## 2020-09-21 NOTE — Telephone Encounter (Signed)
I return Pt call, LVM to call me back since she call me

## 2020-09-21 NOTE — Telephone Encounter (Signed)
Copied from CRM [id number]. Topic: General - Inquiry >> Sep 16, 2020  2:39 PM Crist Infante wrote: Reason for CRM: pt would like carlos to call her >> Sep 17, 2020 10:55 AM Gwenlyn Fudge wrote: Pt calling to speak with Mikle Bosworth. Pt states that she is needing assistance with getting her medications. Please advise .

## 2020-09-28 ENCOUNTER — Ambulatory Visit: Payer: Medicaid Other | Admitting: Pulmonary Disease

## 2020-10-16 ENCOUNTER — Other Ambulatory Visit (HOSPITAL_COMMUNITY)
Admission: RE | Admit: 2020-10-16 | Discharge: 2020-10-16 | Disposition: A | Discharge status: Home / Self Care | Payer: Medicaid Other | Source: Ambulatory Visit | Attending: Pulmonary Disease | Admitting: Pulmonary Disease

## 2020-10-16 DIAGNOSIS — Z01812 Encounter for preprocedural laboratory examination: Secondary | ICD-10-CM | POA: Insufficient documentation

## 2020-10-16 DIAGNOSIS — Z20822 Contact with and (suspected) exposure to covid-19: Secondary | ICD-10-CM | POA: Diagnosis not present

## 2020-10-16 LAB — SARS CORONAVIRUS 2 (TAT 6-24 HRS): SARS Coronavirus 2: NEGATIVE

## 2020-10-18 ENCOUNTER — Other Ambulatory Visit: Payer: Self-pay | Admitting: Pulmonary Disease

## 2020-10-18 DIAGNOSIS — J449 Chronic obstructive pulmonary disease, unspecified: Secondary | ICD-10-CM

## 2020-10-19 ENCOUNTER — Encounter: Payer: Self-pay | Admitting: Pulmonary Disease

## 2020-10-19 ENCOUNTER — Ambulatory Visit (INDEPENDENT_AMBULATORY_CARE_PROVIDER_SITE_OTHER): Payer: Self-pay | Admitting: Pulmonary Disease

## 2020-10-19 ENCOUNTER — Other Ambulatory Visit: Payer: Self-pay

## 2020-10-19 VITALS — BP 104/68 | HR 81 | Temp 97.2°F | Ht 61.0 in | Wt 105.1 lb

## 2020-10-19 DIAGNOSIS — J449 Chronic obstructive pulmonary disease, unspecified: Secondary | ICD-10-CM

## 2020-10-19 LAB — PULMONARY FUNCTION TEST
DL/VA % pred: 101 %
DL/VA: 4.38 ml/min/mmHg/L
DLCO cor % pred: 81 %
DLCO cor: 15.01 ml/min/mmHg
DLCO unc % pred: 81 %
DLCO unc: 15.01 ml/min/mmHg
FEF 25-75 Post: 0.59 L/sec
FEF 25-75 Pre: 0.5 L/sec
FEF2575-%Change-Post: 18 %
FEF2575-%Pred-Post: 26 %
FEF2575-%Pred-Pre: 21 %
FEV1-%Change-Post: 2 %
FEV1-%Pred-Post: 49 %
FEV1-%Pred-Pre: 48 %
FEV1-Post: 1.15 L
FEV1-Pre: 1.12 L
FEV1FVC-%Change-Post: -2 %
FEV1FVC-%Pred-Pre: 69 %
FEV6-%Change-Post: 5 %
FEV6-%Pred-Post: 73 %
FEV6-%Pred-Pre: 68 %
FEV6-Post: 2.12 L
FEV6-Pre: 2 L
FEV6FVC-%Change-Post: 0 %
FEV6FVC-%Pred-Post: 100 %
FEV6FVC-%Pred-Pre: 100 %
FVC-%Change-Post: 5 %
FVC-%Pred-Post: 72 %
FVC-%Pred-Pre: 68 %
FVC-Post: 2.18 L
FVC-Pre: 2.06 L
Post FEV1/FVC ratio: 53 %
Post FEV6/FVC ratio: 97 %
Pre FEV1/FVC ratio: 55 %
Pre FEV6/FVC Ratio: 97 %
RV % pred: 155 %
RV: 2.8 L
TLC % pred: 107 %
TLC: 4.96 L

## 2020-10-19 MED ORDER — BREZTRI AEROSPHERE 160-9-4.8 MCG/ACT IN AERO: 2.0000 | INHALATION_SPRAY | Freq: Two times a day (BID) | RESPIRATORY_TRACT | 0 refills | Status: DC

## 2020-10-19 NOTE — Patient Instructions (Addendum)
Severe COPD, GOLD Class C/D --START Breztri TWO puffs TWICE a day.   (This is equivalent to Incruse ONE puff ONCE a day PLUS Dulera TWO puffs TWICE a day) --CONTINUE Albuterol as needed --Will refer to [facility] for inhaler assistance  Follow-up with me 3 months Please call for worsening symptoms including shortness of breath, wheezing or cough.

## 2020-10-19 NOTE — Progress Notes (Signed)
Subjective:   PATIENT ID: Tami Butler GENDER: female DOB: June 12, 1961, MRN: [id number]   HPI  Chief Complaint  Patient presents with  . Consult    PFT done today.  Having SHOB with activity x 6 months.     Reason for Visit: Hospital follow-up  Ms. Tami Butler is a 60 year old female former smoker with probably COPD who presents for hospital follow-up.  She was recently admitted from 09/05/20-09/08/20 for acute hypoxemic respiratory failure secondary to presumed COPD exacerbation. She was treated with steroids, bronchodilators and antibiotics. Pulmonary inpatient team was consulted. We recommended discharge home with [facility] and Incruse. She has tolerated these inhalers well and has noticed a significant improvement in her symptoms. She did not require oxygen at discharge. She has not needed additional nebulizer treatment since being home. Denies cough, wheezing. Has dyspnea on exertion but only with heavy activity. At home, she is helping her daughter with their newborn.  Social History: 38 pack years. Quit in 07/2020.  I have personally reviewed patient's past medical/family/social history, allergies, current medications.  Past Medical History:  Diagnosis Date  . Arthritis   . COPD (chronic obstructive pulmonary disease) (HCC)   . Goiter   . Migraines      Family History  Problem Relation Age of Onset  . Lymphoma Mother   . Dementia Father   . Cancer Other      Social History   Occupational History  . Occupation: unemployed  Tobacco Use  . Smoking status: Former Smoker    Packs/day: 1.00    Years: 38.00    Pack years: 38.00    Types: Cigarettes    Quit date: 07/2020    Years since quitting: 0.2  . Smokeless tobacco: Never Used  Substance and Sexual Activity  . Alcohol use: Not Currently    Comment: occ  . Drug use: Yes    Types: Marijuana    Comment: occasional   . Sexual activity: Not on file    No Known Allergies   Outpatient Medications Prior to  Visit  Medication Sig Dispense Refill  . acetaminophen (TYLENOL) 500 MG tablet Take 1,000 mg by mouth every 6 (six) hours as needed for mild pain.    Marland Kitchen albuterol (PROVENTIL) (2.5 MG/3ML) 0.083% nebulizer solution Take 3 mLs (2.5 mg total) by nebulization every 4 (four) hours as needed for wheezing or shortness of breath. 75 mL 12  . albuterol (VENTOLIN HFA) 108 (90 Base) MCG/ACT inhaler Inhale 2 puffs into the lungs every 6 (six) hours as needed for wheezing or shortness of breath. 8 g 2  . mometasone-formoterol (DULERA) 200-5 MCG/ACT AERO Inhale 2 puffs into the lungs 2 (two) times daily. 1 each 1  . azithromycin (ZITHROMAX Z-PAK) 250 MG tablet Take 500mg  po on day 1 and then 250mg  po daily until complete (Patient not taking: Reported on 10/19/2020) 6 each 0  . buPROPion (WELLBUTRIN XL) 150 MG 24 hr tablet Take 1 tablet (150 mg total) by mouth daily. (Patient not taking: Reported on 10/19/2020) 90 tablet 1  . guaiFENesin (MUCINEX) 600 MG 12 hr tablet Take 1 tablet (600 mg total) by mouth 2 (two) times daily. (Patient not taking: Reported on 10/19/2020) 30 tablet 0  . Melatonin 5 MG CAPS Take 1 capsule (5 mg total) by mouth at bedtime as needed. (Patient not taking: Reported on 10/19/2020) 90 capsule 1  . predniSONE (DELTASONE) 10 MG tablet Take 40mg  po daily for 4 days then 30mg  daily for  2 days then 20mg  daily for 2 days then 10mg  daily for 2 days then stop (Patient not taking: Reported on 10/19/2020) 28 tablet 0  . umeclidinium bromide (INCRUSE ELLIPTA) 62.5 MCG/INH AEPB Inhale 1 puff into the lungs daily. (Patient not taking: Reported on 10/19/2020) 30 each 1   No facility-administered medications prior to visit.    Review of Systems  Constitutional: Negative for chills, diaphoresis, fever, malaise/fatigue and weight loss.  HENT: Negative for congestion, ear pain and sore throat.   Respiratory: Negative for cough, hemoptysis, sputum production, shortness of breath and wheezing.   Cardiovascular:  Negative for chest pain, palpitations and leg swelling.  Gastrointestinal: Negative for abdominal pain, heartburn and nausea.  Genitourinary: Negative for frequency.  Musculoskeletal: Negative for joint pain and myalgias.  Skin: Negative for itching and rash.  Neurological: Negative for dizziness, weakness and headaches.  Endo/Heme/Allergies: Does not bruise/bleed easily.  Psychiatric/Behavioral: Negative for depression. The patient is not nervous/anxious.      Objective:   Vitals:   10/19/20 1425  BP: 104/68  Pulse: 81  Temp: (!) 97.2 F (36.2 C)  TempSrc: Tympanic  SpO2: 98%  Weight: 105 lb 2 oz (47.7 kg)  Height: 5\' 1"  (1.549 m)   SpO2: 98 %   Body mass index is 19.86 kg/m.  Physical Exam: General: Well-appearing, no acute distress HENT: [place], AT Eyes: EOMI, no scleral icterus Respiratory: Clear to auscultation bilaterally.  No crackles, wheezing or rales Cardiovascular: RRR, -M/R/G, no JVD Extremities:-Edema,-tenderness Neuro: AAO x4, CNII-XII grossly intact Skin: Intact, no rashes or bruising Psych: Normal mood, normal affect  Data Reviewed:  Imaging: CXR 09/08/20 - Hyperinflated lungs. Bronchitic thickening. No infiltrate, effusion or edema.  PFT: 10/19/20 FVC 2.18 (72%) FEV1 1.15 (49%) Ratio 55  TLC 107% DLCO 81% Interpretation: Severe obstructive defect with air trapping. Normal gas exchange. No significant bronchodilator repsonse.  Labs: CBC    Component Value Date/Time   WBC 7.1 09/08/2020 0024   RBC 4.21 09/08/2020 0024   HGB 12.6 09/08/2020 0024   HCT 38.5 09/08/2020 0024   PLT 223 09/08/2020 0024   MCV 91.4 09/08/2020 0024   MCH 29.9 09/08/2020 0024   MCHC 32.7 09/08/2020 0024   RDW 13.2 09/08/2020 0024   LYMPHSABS 1.0 09/05/2020 1832   MONOABS 0.5 09/05/2020 1832   EOSABS 0.3 09/05/2020 1832   BASOSABS 0.0 09/05/2020 1832   Absolute eos 09/05/20 - 300  Imaging, labs and test noted above have been reviewed independently by me.     Assessment & Plan:   Discussion: 60 year old female with COPD. Reviewed PFTs in clinic with patient. Results show severe obstructive defect with air trapping. Has concern about affordability of inhalers. Will provide sample and refer to community health. Will change inhalers based on product availability. We spent time discussing her clinical course and management of COPD. Addressed questions and concerns.  Severe COPD, GOLD Class C/D - well controlled --START Breztri TWO puffs TWICE a day.   (This is equivalent to Incruse ONE puff ONCE a day PLUS Dulera TWO puffs TWICE a day) --CONTINUE Albuterol as needed --Will refer to [facility] for inhaler assistance  Follow-up with me 3 months  Health Maintenance Immunization History  Administered Date(s) Administered  . Influenza,inj,Quad PF,6+ Mos 07/25/2016, 09/08/2020   CT Lung Screen - will discuss at next visit  No orders of the defined types were placed in this encounter.  Meds ordered this encounter  Medications  . Budeson-Glycopyrrol-Formoterol (BREZTRI AEROSPHERE) 160-9-4.8 MCG/ACT AERO  Sig: Inhale 2 puffs into the lungs 2 (two) times daily.    Dispense:  23.6 g    Refill:  0    Order Specific Question:   Lot Number?    Answer:   [id number]    Order Specific Question:   Expiration Date?    Answer:   05/08/2022    Order Specific Question:   Manufacturer?    Answer:   [employer] [[id number]]    Return in about 3 months (around 01/17/2021).  I have spent a total time of 31-minutes on the day of the appointment reviewing prior documentation, coordinating care and discussing medical diagnosis and plan with the patient/family. Imaging, labs and tests included in this note have been reviewed and interpreted independently by me.  Maree Ainley Mechele Collin, MD [facility] 10/19/2020 3:01 PM  Office Number [phone]

## 2020-10-19 NOTE — Progress Notes (Signed)
PFT done today. 

## 2020-11-05 ENCOUNTER — Encounter: Payer: Self-pay | Admitting: Pulmonary Disease

## 2020-11-16 ENCOUNTER — Ambulatory Visit (INDEPENDENT_AMBULATORY_CARE_PROVIDER_SITE_OTHER): Payer: Medicaid Other | Admitting: Primary Care

## 2021-03-14 ENCOUNTER — Other Ambulatory Visit: Payer: Self-pay | Admitting: *Deleted

## 2021-03-14 ENCOUNTER — Telehealth: Payer: Self-pay | Admitting: Pulmonary Disease

## 2021-03-14 MED ORDER — BREZTRI AEROSPHERE 160-9-4.8 MCG/ACT IN AERO: 2.0000 | INHALATION_SPRAY | Freq: Two times a day (BID) | RESPIRATORY_TRACT | 0 refills | Status: DC

## 2021-03-14 NOTE — Telephone Encounter (Signed)
Patient called back and spoke with her, she ran out of her Markus Daft a month ago and has no insurance.  Dr. Everardo All had referred her to the [facility] and [facility], she had a virtual visit and said it was a nightmare after that, no one seemed to know what was going on.  I advised her I would put some samples up front for her to have picked up and would contact the [facility] and wellness center to see about getting her established there to get her medications until she can get medicaid.  She verbalized understanding.  Samples placed up front for patient.  I called the  and wellness center and spoke with [facility], she states that the patient is seen at a sister clinic to them called [facility] and her pcp is Gwinda Passe.  I was advised that they do medication assistance as well all the patient has to do is schedule an appointment with Marcelino Duster.  Called and spoke with patient's daughter Clinton Gallant [facility]) advised that patient can see PCP at [facility] and they can assist her with her inhaler.  Daughter asked if she could come by and get the samples for her mom.  I asked if she was close as we closed at 5 pm.  She said yes.  Advised I would meet her at the door with the samples.  Nothing further needed.

## 2021-03-14 NOTE — Telephone Encounter (Signed)
I tried to call the patient at [phone] and reached her daughter.  I called [phone] and the patient could not hear me so I hung up and tried again, I even tried picking up the handset to talk.  I called again from another phone and reached a busy signal.

## 2021-03-14 NOTE — Telephone Encounter (Signed)
I attempted to call her a 2nd time from another phone and reached a busy signal.  Will try another time.

## 2021-03-14 NOTE — Telephone Encounter (Signed)
LMTC x 1  

## 2021-06-25 ENCOUNTER — Emergency Department (HOSPITAL_BASED_OUTPATIENT_CLINIC_OR_DEPARTMENT_OTHER)
Admission: EM | Admit: 2021-06-25 | Discharge: 2021-06-25 | Disposition: A | Discharge status: Home / Self Care | Payer: Self-pay | Attending: Emergency Medicine | Admitting: Emergency Medicine

## 2021-06-25 ENCOUNTER — Emergency Department (HOSPITAL_BASED_OUTPATIENT_CLINIC_OR_DEPARTMENT_OTHER): Payer: Self-pay

## 2021-06-25 ENCOUNTER — Encounter (HOSPITAL_BASED_OUTPATIENT_CLINIC_OR_DEPARTMENT_OTHER): Payer: Self-pay | Admitting: Emergency Medicine

## 2021-06-25 ENCOUNTER — Other Ambulatory Visit: Payer: Self-pay

## 2021-06-25 DIAGNOSIS — Z7951 Long term (current) use of inhaled steroids: Secondary | ICD-10-CM | POA: Insufficient documentation

## 2021-06-25 DIAGNOSIS — Z87891 Personal history of nicotine dependence: Secondary | ICD-10-CM | POA: Insufficient documentation

## 2021-06-25 DIAGNOSIS — R202 Paresthesia of skin: Secondary | ICD-10-CM | POA: Insufficient documentation

## 2021-06-25 DIAGNOSIS — J449 Chronic obstructive pulmonary disease, unspecified: Secondary | ICD-10-CM | POA: Insufficient documentation

## 2021-06-25 DIAGNOSIS — R079 Chest pain, unspecified: Secondary | ICD-10-CM | POA: Insufficient documentation

## 2021-06-25 LAB — COMPREHENSIVE METABOLIC PANEL
ALT: 17 U/L (ref 0–44)
AST: 22 U/L (ref 15–41)
Albumin: 4.4 g/dL (ref 3.5–5.0)
Alkaline Phosphatase: 61 U/L (ref 38–126)
Anion gap: 8 (ref 5–15)
BUN: 17 mg/dL (ref 6–20)
CO2: 30 mmol/L (ref 22–32)
Calcium: 9.3 mg/dL (ref 8.9–10.3)
Chloride: 98 mmol/L (ref 98–111)
Creatinine, Ser: 0.71 mg/dL (ref 0.44–1.00)
GFR, Estimated: >60 mL/min (ref >60)
Glucose, Bld: 112 mg/dL — ABNORMAL HIGH (ref 70–99)
Potassium: 4.1 mmol/L (ref 3.5–5.1)
Sodium: 136 mmol/L (ref 135–145)
Total Bilirubin: 1.1 mg/dL (ref 0.3–1.2)
Total Protein: 7.6 g/dL (ref 6.5–8.1)

## 2021-06-25 LAB — CBC WITH DIFFERENTIAL/PLATELET
Abs Immature Granulocytes: 0.02 10*3/uL (ref 0.00–0.07)
Basophils Absolute: 0 10*3/uL (ref 0.0–0.1)
Basophils Relative: 0 %
Eosinophils Absolute: 0.2 10*3/uL (ref 0.0–0.5)
Eosinophils Relative: 2 %
HCT: 40.5 % (ref 36.0–46.0)
Hemoglobin: 13.4 g/dL (ref 12.0–15.0)
Immature Granulocytes: 0 %
Lymphocytes Relative: 17 %
Lymphs Abs: 1.3 10*3/uL (ref 0.7–4.0)
MCH: 30.5 pg (ref 26.0–34.0)
MCHC: 33.1 g/dL (ref 30.0–36.0)
MCV: 92.3 fL (ref 80.0–100.0)
Monocytes Absolute: 0.4 10*3/uL (ref 0.1–1.0)
Monocytes Relative: 5 %
Neutro Abs: 5.5 10*3/uL (ref 1.7–7.7)
Neutrophils Relative %: 76 %
Platelets: 237 10*3/uL (ref 150–400)
RBC: 4.39 MIL/uL (ref 3.87–5.11)
RDW: 13.2 % (ref 11.5–15.5)
WBC: 7.4 10*3/uL (ref 4.0–10.5)
nRBC: 0 % (ref 0.0–0.2)

## 2021-06-25 LAB — TROPONIN I (HIGH SENSITIVITY)
Troponin I (High Sensitivity): <2 ng/L (ref <18)
Troponin I (High Sensitivity): 2 ng/L (ref <18)

## 2021-06-25 MED ORDER — METOCLOPRAMIDE HCL 5 MG/ML IJ SOLN
10.0000 mg | Freq: Once | INTRAMUSCULAR | Status: DC
  Filled 2021-06-25: qty 2

## 2021-06-25 MED ORDER — KETOROLAC TROMETHAMINE 30 MG/ML IJ SOLN
30.0000 mg | Freq: Once | INTRAMUSCULAR | Status: AC
  Administered 2021-06-25: 30 mg via INTRAVENOUS
  Filled 2021-06-25: qty 1

## 2021-06-25 MED ORDER — ONDANSETRON HCL 4 MG/2ML IJ SOLN
4.0000 mg | Freq: Once | INTRAMUSCULAR | Status: AC
  Administered 2021-06-25: 4 mg via INTRAVENOUS
  Filled 2021-06-25: qty 2

## 2021-06-25 NOTE — ED Provider Notes (Signed)
[facility] EMERGENCY DEPARTMENT Provider Note   CSN: [id number] Arrival date & time: 06/25/21  1622     History Chief Complaint  Patient presents with   Chest Pain   Numbness    ADWOA AXE is a 60 y.o. female.  Is here with multiple complaints.  Left-sided chest pain into her back that is been going on and off for 2 weeks.  She says it feels like a gas pain.  She is also had some numbness in her left arm from her elbow to her fingertips its been going on and off for a week.  Today she also felt like she had some numbness in her left lower lip.  No headache blurry vision double vision difficulty speaking or walking.  No focal weakness.  The history is provided by the patient.  Chest Pain Pain location:  L chest Pain quality: aching   Pain severity:  Moderate Onset quality:  Gradual Duration:  2 weeks Timing:  Intermittent Progression:  Unchanged Chronicity:  New Relieved by:  None tried Worsened by:  Nothing Ineffective treatments:  None tried Associated symptoms: back pain and numbness   Associated symptoms: no abdominal pain, no cough, no diaphoresis, no dysphagia, no fever, no headache, no nausea, no shortness of breath, no vomiting and no weakness       Past Medical History:  Diagnosis Date   Arthritis    COPD (chronic obstructive pulmonary disease) (HCC)    Goiter    Migraines     Patient Active Problem List   Diagnosis Date Noted   Malnutrition related to chronic disease (HCC) 09/06/2020   High risk social situation 09/06/2020   Anxiety and depression 08/01/2016   COPD mixed type (HCC) 08/01/2016   Tobacco abuse 08/01/2016   Kidney stones 08/01/2016    Past Surgical History:  Procedure Laterality Date   CESAREAN SECTION     x4   TUMOR REMOVAL       OB History   No obstetric history on file.     Family History  Problem Relation Age of Onset   Lymphoma Mother    Dementia Father    Cancer Other     Social History   Tobacco  Use   Smoking status: Former    Packs/day: 1.00    Years: 38.00    Pack years: 38.00    Types: Cigarettes    Quit date: 07/2020    Years since quitting: 0.9   Smokeless tobacco: Never  Substance Use Topics   Alcohol use: Not Currently    Comment: occ   Drug use: Yes    Types: Marijuana    Comment: occasional     Home Medications Prior to Admission medications   Medication Sig Start Date End Date Taking? Authorizing Provider  acetaminophen (TYLENOL) 500 MG tablet Take 1,000 mg by mouth every 6 (six) hours as needed for mild pain.    [provider]  albuterol (PROVENTIL) (2.5 MG/3ML) 0.083% nebulizer solution USE 1 VIAL BY NEBULIZATION EVERY FOUR HOURS AS NEEDED FOR WHEEZING OR SHORTNESS OF BREATH. 09/08/20 09/08/21  Erick Blinks, MD  albuterol (VENTOLIN HFA) 108 (90 Base) MCG/ACT inhaler INHALE 2 PUFFS INTO THE LUNGS EVERY SIX HOURS AS NEEDED FOR WHEEZING OR SHORTNESS OF BREATH. 09/08/20 09/08/21  Erick Blinks, MD  azithromycin (ZITHROMAX) 250 MG tablet TAKE 2 TABLETS (500MG ) BY MY MOUTH ON DAY 1 AND THEN 1 TABLET (250MG ) PO DAILY UNTIL COMPLETE Patient not taking: Reported on 10/19/2020 09/08/20 09/08/21  Erick Blinks, MD  Budeson-Glycopyrrol-Formoterol (BREZTRI AEROSPHERE) 160-9-4.8 MCG/ACT AERO Inhale 2 puffs into the lungs 2 (two) times daily. 10/19/20   Luciano Cutter, MD  Budeson-Glycopyrrol-Formoterol (BREZTRI AEROSPHERE) 160-9-4.8 MCG/ACT AERO Inhale 2 puffs into the lungs in the morning and at bedtime. 03/14/21   Luciano Cutter, MD  buPROPion (WELLBUTRIN XL) 150 MG 24 hr tablet Take 1 tablet (150 mg total) by mouth daily. Patient not taking: Reported on 10/19/2020 09/20/20   Grayce Sessions, NP  guaiFENesin (MUCINEX) 600 MG 12 hr tablet Take 1 tablet (600 mg total) by mouth 2 (two) times daily. Patient not taking: Reported on 10/19/2020 09/08/20   Erick Blinks, MD  Melatonin 5 MG CAPS Take 1 capsule (5 mg total) by mouth at bedtime as needed. Patient not  taking: Reported on 10/19/2020 09/20/20   Grayce Sessions, NP  mometasone-formoterol (DULERA) 200-5 MCG/ACT AERO INHALE 2 PUFFS INTO THE LUNGS TWO TIMES DAILY. 09/08/20 09/08/21  Erick Blinks, MD  predniSONE (DELTASONE) 10 MG tablet TAKE 4 TABLETS (40MG ) BY MOUTH FOR 4 DAYS,3 TABS (30MG ) FOR 2 DAYS,2 TABS (20MG ) FOR 2 DAYS,THEN 1 TAB (10MG ) FOR 2 DAYS THEN STOP Patient not taking: Reported on 10/19/2020 09/08/20 09/08/21  , MD  umeclidinium bromide (INCRUSE ELLIPTA) 62.5 MCG/INH AEPB INHALE 1 PUFF INTO THE LUNGS DAILY. Patient not taking: Reported on 10/19/2020 09/08/20 09/08/21  Erick Blinks, MD    Allergies    Patient has no known allergies.  Review of Systems   Review of Systems  Constitutional:  Negative for diaphoresis and fever.  HENT:  Negative for sore throat and trouble swallowing.   Eyes:  Negative for visual disturbance.  Respiratory:  Negative for cough and shortness of breath.   Cardiovascular:  Positive for chest pain.  Gastrointestinal:  Negative for abdominal pain, nausea and vomiting.  Genitourinary:  Negative for dysuria.  Musculoskeletal:  Positive for back pain.  Skin:  Negative for rash.  Neurological:  Positive for numbness. Negative for speech difficulty, weakness and headaches.   Physical Exam Updated Vital Signs BP 124/79 (BP Location: Left Arm)   Pulse 80   Temp 98.3 F (36.8 C) (Oral)   Resp 20   Ht 5' (1.524 m)   Wt 52.2 kg   SpO2 93%   BMI 22.46 kg/m   Physical Exam Vitals and nursing note reviewed.  Constitutional:      General: She is not in acute distress.    Appearance: Normal appearance. She is well-developed.  HENT:     Head: Normocephalic and atraumatic.  Eyes:     Conjunctiva/sclera: Conjunctivae normal.  Cardiovascular:     Rate and Rhythm: Normal rate and regular rhythm.     Heart sounds: No murmur heard. Pulmonary:     Effort: Pulmonary effort is normal. No respiratory distress.     Breath sounds: Normal breath  sounds. No stridor. No wheezing.  Abdominal:     Palpations: Abdomen is soft.     Tenderness: There is no abdominal tenderness.  Musculoskeletal:        General: No tenderness. Normal range of motion.     Cervical back: Neck supple.  Skin:    General: Skin is warm and dry.  Neurological:     General: No focal deficit present.     Mental Status: She is alert and oriented to person, place, and time.     GCS: GCS eye subscore is 4. GCS verbal subscore is 5. GCS motor subscore is 6.  Cranial Nerves: No cranial nerve deficit.     Sensory: No sensory deficit.     Motor: No weakness.     Gait: Gait normal.    ED Results / Procedures / Treatments   Labs (all labs ordered are listed, but only abnormal results are displayed) Labs Reviewed  COMPREHENSIVE METABOLIC PANEL - Abnormal; Notable for the following components:      Result Value   Glucose, Bld 112 (*)    All other components within normal limits  CBC WITH DIFFERENTIAL/PLATELET  TROPONIN I (HIGH SENSITIVITY)  TROPONIN I (HIGH SENSITIVITY)    EKG EKG Interpretation  Date/Time:  Saturday June 25 2021 16:28:57 EDT Ventricular Rate:  76 PR Interval:  140 QRS Duration: 76 QT Interval:  374 QTC Calculation: 420 R Axis:   78 Text Interpretation: Normal sinus rhythm Normal ECG rate slower than prior 11/21 Confirmed by Meridee Score [phone]) on 06/25/2021 4:30:51 PM  Radiology CT Head Wo Contrast  Result Date: 06/25/2021 CLINICAL DATA:  60 year old female with LEFT UPPER extremity and LEFT facial numbness for 1 day. EXAM: CT HEAD WITHOUT CONTRAST TECHNIQUE: Contiguous axial images were obtained from the base of the skull through the vertex without intravenous contrast. COMPARISON:  12/13/2014 CT FINDINGS: Brain: No evidence of acute infarction, hemorrhage, hydrocephalus, extra-axial collection or mass lesion/mass effect. Vascular: No hyperdense vessel or unexpected calcification. Skull: Normal. Negative for fracture or focal  lesion. Sinuses/Orbits: No acute finding. Other: None. IMPRESSION: Unremarkable noncontrast head CT. Electronically Signed   By: Harmon Pier M.D.   On: 06/25/2021 18:38   DG Chest Port 1 View  Result Date: 06/25/2021 CLINICAL DATA:  Chest pain. EXAM: PORTABLE CHEST 1 VIEW COMPARISON:  September 08, 2020 FINDINGS: The heart size and mediastinal contours are within normal limits. Both lungs are clear. The visualized skeletal structures are unremarkable. IMPRESSION: No active disease. Electronically Signed   By: Gerome Sam III M.D.   On: 06/25/2021 17:31    Procedures Procedures   Medications Ordered in ED Medications  ondansetron (ZOFRAN) injection 4 mg (4 mg Intravenous Given 06/25/21 1857)  ketorolac (TORADOL) 30 MG/ML injection 30 mg (30 mg Intravenous Given 06/25/21 2000)    ED Course  I have reviewed the triage vital signs and the nursing notes.  Pertinent labs & imaging results that were available during my care of the patient were reviewed by me and considered in my medical decision making (see chart for details).  Clinical Course as of 06/26/21 0174  Sat Jun 25, 2021  1732 Chest x-ray does not show any acute findings.  Awaiting radiology reading. [MB]  2055 Viewed results of work-up with patient.  Explained limitations of a head CT but so far her work-up has been unremarkable.  Her exam does not have any objective findings and has been intermittent set do not feel MRI is currently indicated.  Recommended close follow-up with PCP and return instructions discussed [MB]    Clinical Course User Index [MB] Terrilee Files, MD   MDM Rules/Calculators/A&P                          This patient complains of intermittent chest pain, intermittent numbness of left forearm and hand, numbness of the left lower lip; this involves an extensive number of treatment Options and is a complaint that carries with it a high risk of complications and Morbidity. The differential includes ACS,  pneumonia, GERD, dissection, stroke, bleed, peripheral neuropathy, metabolic derangement  I ordered, reviewed and interpreted labs, which included CBC with normal white count normal hemoglobin, chemistries normal, LFTs normal, troponins flat I ordered medication IV Toradol and Zofran for headache I ordered imaging studies which included chest x-ray and head CT and I independently    visualized and interpreted imaging which showed no acute findings  Previous records obtained and reviewed in epic no recent admissions   After the interventions stated above, I reevaluated the patient and found patient to be neurologically intact and hemodynamically stable.  Recommended close follow-up with her primary care doctor regarding her symptoms and likely need for further evaluation.  Return instructions discussed'   Final Clinical Impression(s) / ED Diagnoses Final diagnoses:  Nonspecific chest pain  Paresthesia    Rx / DC Orders ED Discharge Orders     None        Terrilee Files, MD 06/26/21 [phone]

## 2021-06-25 NOTE — Discharge Instructions (Signed)
You are seen in the emergency department for intermittent chest pain and intermittent numbness of your left arm and left face.  You had blood work and an EKG that did not show any evidence of heart injury.  You also had a CAT scan of your brain that did not show any obvious stroke.  Please continue your regular medication and follow-up with your primary care doctor.  Return to the emergency department if any worsening or concerning symptoms

## 2021-06-25 NOTE — ED Notes (Signed)
Patient transported to CT 

## 2021-06-25 NOTE — ED Triage Notes (Signed)
Pt c/o CP intermittently x few weeks; Reports LUE and LT side facial numbness since about 1330 today

## 2021-09-02 ENCOUNTER — Encounter (HOSPITAL_BASED_OUTPATIENT_CLINIC_OR_DEPARTMENT_OTHER): Payer: Self-pay

## 2021-09-02 ENCOUNTER — Emergency Department (HOSPITAL_BASED_OUTPATIENT_CLINIC_OR_DEPARTMENT_OTHER)
Admission: EM | Admit: 2021-09-02 | Discharge: 2021-09-02 | Disposition: A | Discharge status: Home / Self Care | Payer: Medicaid Other | Attending: Emergency Medicine | Admitting: Emergency Medicine

## 2021-09-02 ENCOUNTER — Other Ambulatory Visit: Payer: Self-pay

## 2021-09-02 ENCOUNTER — Emergency Department (HOSPITAL_BASED_OUTPATIENT_CLINIC_OR_DEPARTMENT_OTHER): Payer: Medicaid Other

## 2021-09-02 DIAGNOSIS — Z20822 Contact with and (suspected) exposure to covid-19: Secondary | ICD-10-CM | POA: Insufficient documentation

## 2021-09-02 DIAGNOSIS — E876 Hypokalemia: Secondary | ICD-10-CM

## 2021-09-02 DIAGNOSIS — J441 Chronic obstructive pulmonary disease with (acute) exacerbation: Secondary | ICD-10-CM

## 2021-09-02 DIAGNOSIS — Z87891 Personal history of nicotine dependence: Secondary | ICD-10-CM | POA: Insufficient documentation

## 2021-09-02 DIAGNOSIS — Z7951 Long term (current) use of inhaled steroids: Secondary | ICD-10-CM | POA: Insufficient documentation

## 2021-09-02 LAB — CBC WITH DIFFERENTIAL/PLATELET
Abs Immature Granulocytes: 0.03 10*3/uL (ref 0.00–0.07)
Basophils Absolute: 0 10*3/uL (ref 0.0–0.1)
Basophils Relative: 0 %
Eosinophils Absolute: 0.1 10*3/uL (ref 0.0–0.5)
Eosinophils Relative: 1 %
HCT: 37.3 % (ref 36.0–46.0)
Hemoglobin: 12.3 g/dL (ref 12.0–15.0)
Immature Granulocytes: 0 %
Lymphocytes Relative: 15 %
Lymphs Abs: 1.2 10*3/uL (ref 0.7–4.0)
MCH: 30.1 pg (ref 26.0–34.0)
MCHC: 33 g/dL (ref 30.0–36.0)
MCV: 91.4 fL (ref 80.0–100.0)
Monocytes Absolute: 0.6 10*3/uL (ref 0.1–1.0)
Monocytes Relative: 8 %
Neutro Abs: 5.9 10*3/uL (ref 1.7–7.7)
Neutrophils Relative %: 76 %
Platelets: 291 10*3/uL (ref 150–400)
RBC: 4.08 MIL/uL (ref 3.87–5.11)
RDW: 12.4 % (ref 11.5–15.5)
WBC: 7.8 10*3/uL (ref 4.0–10.5)
nRBC: 0 % (ref 0.0–0.2)

## 2021-09-02 LAB — COMPREHENSIVE METABOLIC PANEL
ALT: 14 U/L (ref 0–44)
AST: 18 U/L (ref 15–41)
Albumin: 3.8 g/dL (ref 3.5–5.0)
Alkaline Phosphatase: 74 U/L (ref 38–126)
Anion gap: 11 (ref 5–15)
BUN: 8 mg/dL (ref 6–20)
CO2: 26 mmol/L (ref 22–32)
Calcium: 9 mg/dL (ref 8.9–10.3)
Chloride: 100 mmol/L (ref 98–111)
Creatinine, Ser: 0.62 mg/dL (ref 0.44–1.00)
GFR, Estimated: >60 mL/min (ref >60)
Glucose, Bld: 106 mg/dL — ABNORMAL HIGH (ref 70–99)
Potassium: 3 mmol/L — ABNORMAL LOW (ref 3.5–5.1)
Sodium: 137 mmol/L (ref 135–145)
Total Bilirubin: 0.3 mg/dL (ref 0.3–1.2)
Total Protein: 7.6 g/dL (ref 6.5–8.1)

## 2021-09-02 LAB — RESP PANEL BY RT-PCR (FLU A&B, COVID) ARPGX2
Influenza A by PCR: NEGATIVE
Influenza B by PCR: NEGATIVE
SARS Coronavirus 2 by RT PCR: NEGATIVE

## 2021-09-02 LAB — MAGNESIUM: Magnesium: 1.8 mg/dL (ref 1.7–2.4)

## 2021-09-02 MED ORDER — POTASSIUM CHLORIDE 20 MEQ PO PACK
40.0000 meq | PACK | Freq: Once | ORAL | Status: AC
  Administered 2021-09-02: 40 meq via ORAL
  Filled 2021-09-02: qty 2

## 2021-09-02 MED ORDER — IPRATROPIUM-ALBUTEROL 0.5-2.5 (3) MG/3ML IN SOLN
3.0000 mL | Freq: Once | RESPIRATORY_TRACT | Status: AC
  Administered 2021-09-02: 3 mL via RESPIRATORY_TRACT
  Filled 2021-09-02: qty 3

## 2021-09-02 MED ORDER — METHYLPREDNISOLONE SODIUM SUCC 125 MG IJ SOLR
125.0000 mg | Freq: Every day | INTRAMUSCULAR | Status: DC
  Administered 2021-09-02: 125 mg via INTRAVENOUS
  Filled 2021-09-02: qty 2

## 2021-09-02 MED ORDER — PREDNISONE 20 MG PO TABS: 40.0000 mg | ORAL_TABLET | Freq: Every day | ORAL | 0 refills | Status: AC

## 2021-09-02 MED ORDER — ALBUTEROL SULFATE HFA 108 (90 BASE) MCG/ACT IN AERS
2.0000 | INHALATION_SPRAY | Freq: Once | RESPIRATORY_TRACT | Status: AC
  Administered 2021-09-02: 2 via RESPIRATORY_TRACT
  Filled 2021-09-02: qty 6.7

## 2021-09-02 MED ORDER — POTASSIUM CHLORIDE 10 MEQ/100ML IV SOLN
10.0000 meq | INTRAVENOUS | Status: DC
  Administered 2021-09-02: 10 meq via INTRAVENOUS
  Filled 2021-09-02 (×2): qty 100

## 2021-09-02 NOTE — ED Notes (Signed)
Patient ambulated on RA per order. O2 remained 92-95% and patient feels better than arrival. PA informed of sat.

## 2021-09-02 NOTE — Discharge Instructions (Addendum)
Your laboratory results are within normal limits today.  You received an albuterol inhaler while in the ED.  In addition, we will place you on a short course of steroids in order to help with your breathing.  Please take 2 tablets daily for the next 5 days.  You will need to follow-up with your pulmonologist for a recheck.

## 2021-09-02 NOTE — ED Provider Notes (Addendum)
[facility] EMERGENCY DEPARTMENT Provider Note   CSN: [id number] Arrival date & time: 09/02/21  1355     History Chief Complaint  Patient presents with   Shortness of Breath    Tami Butler is a 60 y.o. female.  60 y.o female with a PMH of COPD stage 3, Tobacco abuse presents to the ED with a chief complaint of shortness of breath that is been ongoing for the last couple of days.  According to patient, she had been helping taking care of her grandkids at home who has been sick with an upper respiratory infection.  She also reports her head cold has not transferred to her chest, she feels very short of breath with any activity along with some chest tightness.  She is reporting a productive cough, with some green sputum.  Has been taking Delsym, Mucinex DM, with out much improvement in symptoms.  She also has run out of her inhaler, she has been using it frequently.  No endorsing a subjective fever. She is followed by [facility]. Currently on no home oxygen. No chest pain, leg swelling or other complaints.   The history is provided by the patient and medical records.  Shortness of Breath Severity:  Moderate Onset quality:  Gradual Duration:  4 days Timing:  Constant Progression:  Worsening Chronicity:  Recurrent Context: URI   Relieved by:  Nothing Worsened by:  Movement and exertion Ineffective treatments:  Inhaler Associated symptoms: cough and fever   Associated symptoms: no abdominal pain, no chest pain and no vomiting   Risk factors: tobacco use   Risk factors: no family hx of DVT and no hx of PE/DVT       Past Medical History:  Diagnosis Date   Arthritis    COPD (chronic obstructive pulmonary disease) ([place])    Goiter    Migraines     Patient Active Problem List   Diagnosis Date Noted   Malnutrition related to chronic disease ([place]) 09/06/2020   High risk social situation 09/06/2020   Anxiety and depression 08/01/2016   COPD mixed type ([place])  08/01/2016   Tobacco abuse 08/01/2016   Kidney stones 08/01/2016    Past Surgical History:  Procedure Laterality Date   CESAREAN SECTION     x4   TUMOR REMOVAL       OB History   No obstetric history on file.     Family History  Problem Relation Age of Onset   Lymphoma Mother    Dementia Father    Cancer Other     Social History   Tobacco Use   Smoking status: Former    Packs/day: 1.00    Years: 38.00    Pack years: 38.00    Types: Cigarettes    Quit date: 07/2020    Years since quitting: 1.1   Smokeless tobacco: Never  Substance Use Topics   Alcohol use: Not Currently    Comment: occ   Drug use: Yes    Types: Marijuana    Comment: occasional     Home Medications Prior to Admission medications   Medication Sig Start Date End Date Taking? Authorizing Provider  predniSONE (DELTASONE) 20 MG tablet Take 2 tablets (40 mg total) by mouth daily for 5 days. 09/02/21 09/07/21 Yes Naythan Douthit, Beverley Fiedler, PA-C  acetaminophen (TYLENOL) 500 MG tablet Take 1,000 mg by mouth every 6 (six) hours as needed for mild pain.    [provider]  albuterol (PROVENTIL) (2.5 MG/3ML) 0.083% nebulizer solution USE  1 VIAL BY NEBULIZATION EVERY FOUR HOURS AS NEEDED FOR WHEEZING OR SHORTNESS OF BREATH. 09/08/20 09/08/21  Erick Blinks, MD  albuterol (VENTOLIN HFA) 108 (90 Base) MCG/ACT inhaler INHALE 2 PUFFS INTO THE LUNGS EVERY SIX HOURS AS NEEDED FOR WHEEZING OR SHORTNESS OF BREATH. 09/08/20 09/08/21  Erick Blinks, MD  azithromycin (ZITHROMAX) 250 MG tablet TAKE 2 TABLETS (500MG ) BY MY MOUTH ON DAY 1 AND THEN 1 TABLET (250MG ) PO DAILY UNTIL COMPLETE Patient not taking: Reported on 10/19/2020 09/08/20 09/08/21  14/1/21, MD  Budeson-Glycopyrrol-Formoterol (BREZTRI AEROSPHERE) 160-9-4.8 MCG/ACT AERO Inhale 2 puffs into the lungs 2 (two) times daily. 10/19/20   Erick Blinks, MD  Budeson-Glycopyrrol-Formoterol (BREZTRI AEROSPHERE) 160-9-4.8 MCG/ACT AERO Inhale 2 puffs into the lungs in  the morning and at bedtime. 03/14/21   Luciano Cutter, MD  buPROPion (WELLBUTRIN XL) 150 MG 24 hr tablet Take 1 tablet (150 mg total) by mouth daily. Patient not taking: Reported on 10/19/2020 09/20/20   12/17/2020, NP  guaiFENesin (MUCINEX) 600 MG 12 hr tablet Take 1 tablet (600 mg total) by mouth 2 (two) times daily. Patient not taking: Reported on 10/19/2020 09/08/20   12/17/2020, MD  Melatonin 5 MG CAPS Take 1 capsule (5 mg total) by mouth at bedtime as needed. Patient not taking: Reported on 10/19/2020 09/20/20   12/17/2020, NP  mometasone-formoterol (DULERA) 200-5 MCG/ACT AERO INHALE 2 PUFFS INTO THE LUNGS TWO TIMES DAILY. 09/08/20 09/08/21  14/1/21, MD  umeclidinium bromide (INCRUSE ELLIPTA) 62.5 MCG/INH AEPB INHALE 1 PUFF INTO THE LUNGS DAILY. Patient not taking: Reported on 10/19/2020 09/08/20 09/08/21  14/1/21, MD    Allergies    Patient has no known allergies.  Review of Systems   Review of Systems  Constitutional:  Positive for fever. Negative for chills.  HENT:  Negative for sinus pressure.   Eyes:  Negative for redness.  Respiratory:  Positive for cough and shortness of breath.   Cardiovascular:  Negative for chest pain.  Gastrointestinal:  Negative for abdominal pain, nausea and vomiting.  Genitourinary:  Negative for flank pain.  Musculoskeletal:  Negative for back pain.  Neurological:  Negative for light-headedness.  All other systems reviewed and are negative.  Physical Exam Updated Vital Signs BP 106/70 (BP Location: Right Arm)   Pulse 90   Temp 98 F (36.7 C) (Oral)   Resp 15   Ht 5' (1.524 m)   Wt 49.9 kg   SpO2 94%   BMI 21.48 kg/m   Physical Exam Vitals and nursing note reviewed.  Constitutional:      Appearance: She is well-developed. She is ill-appearing.  HENT:     Head: Normocephalic and atraumatic.  Cardiovascular:     Rate and Rhythm: Normal rate.  Pulmonary:     Effort: No tachypnea.     Breath sounds:  Examination of the right-upper field reveals rales. Examination of the right-middle field reveals rales. Examination of the right-lower field reveals rales. Examination of the left-lower field reveals rales. Rales present. No rhonchi.  Abdominal:     Palpations: Abdomen is soft.     Tenderness: There is no abdominal tenderness.  Musculoskeletal:     Cervical back: Normal range of motion and neck supple.     Right lower leg: No tenderness. No edema.     Left lower leg: No tenderness. No edema.  Skin:    General: Skin is warm and dry.  Neurological:     Mental Status: She is  alert and oriented to person, place, and time.    ED Results / Procedures / Treatments   Labs (all labs ordered are listed, but only abnormal results are displayed) Labs Reviewed  COMPREHENSIVE METABOLIC PANEL - Abnormal; Notable for the following components:      Result Value   Potassium 3.0 (*)    Glucose, Bld 106 (*)    All other components within normal limits  RESP PANEL BY RT-PCR (FLU A&B, COVID) [id number]  CBC WITH DIFFERENTIAL/PLATELET  MAGNESIUM    EKG None  Radiology DG Chest Portable 1 View  Result Date: 09/02/2021 CLINICAL DATA:  Shortness of breath, headache, cough, sore throat, LEFT ear pain, productive cough, COPD, with around kids with RSV 1 week ago EXAM: PORTABLE CHEST 1 VIEW COMPARISON:  Portable exam 1401 hours compared to 06/25/2021 FINDINGS: Normal heart size, mediastinal contours, and pulmonary vascularity. Atherosclerotic calcification aorta. Calcified mediastinal and hilar lymph nodes. Chronic bronchitic changes without pulmonary infiltrate, pleural effusion, or pneumothorax. Bones demineralized. IMPRESSION: Changes of chronic bronchitis and old granulomatous disease. No acute abnormalities. Aortic Atherosclerosis (ICD10-I70.0). Electronically Signed   By: Lavonia Dana M.D.   On: 09/02/2021 14:24    Procedures Procedures   Medications Ordered in ED Medications  methylPREDNISolone  sodium succinate (SOLU-MEDROL) 125 mg/2 mL injection 125 mg (125 mg Intravenous Given 09/02/21 1641)  albuterol (VENTOLIN HFA) 108 (90 Base) MCG/ACT inhaler 2 puff (has no administration in time range)  potassium chloride (KLOR-CON) packet 40 mEq (has no administration in time range)  ipratropium-albuterol (DUONEB) 0.5-2.5 (3) MG/3ML nebulizer solution 3 mL (3 mLs Nebulization Given by Other 09/02/21 1412)    ED Course  I have reviewed the triage vital signs and the nursing notes.  Pertinent labs & imaging results that were available during my care of the patient were reviewed by me and considered in my medical decision making (see chart for details).    MDM Rules/Calculators/A&P   With a past medical history of COPD stage III presents to the ED with a chief complaint of shortness of breath for the last couple days.  Exacerbated with exertion, no relief with over-the-counter medication.  Also endorsing some productive cough with green sputum.  Subjective fever also noted.  She did have some sick contacts with her grandchildren who were positive for viral URI.  On arrival patient's vitals are within normal limits.  She is overall nontoxic-appearing.  Her lungs are diminished to auscultation throughout, with significant rales noted throughout along with tachypnea noted on my evaluation.  Pharynx is clear without any exudates present but erythema noted.  She was given a breathing treatment on arrival which she reports some improvement in her symptoms.  However, continues to have significant wheezing, and rhonchi.   COVID-19, influenza test were obtained while in triage.  These were both negative.  Her x-rays without any acute findings such as pneumonia, pleural effusion or pneumothorax.  Patient's potassium replacement, she did receive 1 full IV dose, however will prefer to receive the second dose orally.  She does have a history of hypokalemia, and is followed by her PCP for this.  Currently  taking supplemental potassium as well.  Patient was ambulated by respiratory therapist, without any tachycardia, no hypoxia maintaining her oxygen saturation above 90%.  She also is without any tachypnea.  Does report improvement in her symptoms after Solu-Medrol.  I will provide her with an albuterol inhaler while on today's visit, she is to follow-up with pulmonologist in outpatient basis.  Patient understands  and agrees with management at this time, stable for discharge.   Portions of this note were generated with [employer]. Dictation errors may occur despite best attempts at proofreading.  Final Clinical Impression(s) / ED Diagnoses Final diagnoses:  COPD exacerbation ([place])  Hypokalemia    Rx / DC Orders ED Discharge Orders          Ordered    predniSONE (DELTASONE) 20 MG tablet  Daily        09/02/21 1922             Janeece Fitting, PA-C 09/02/21 1927    Janeece Fitting, PA-C 09/02/21 1929    Malvin Johns, MD 09/02/21 2245

## 2021-09-02 NOTE — ED Triage Notes (Signed)
Pt c/o headache, cough, sore throat, left ear pain. States now "moved to chest", feels short of breath, chest tightness, productive cough. Hx of COPD.  Was around kids with RSV a week ago.

## 2022-12-11 ENCOUNTER — Encounter (HOSPITAL_COMMUNITY): Payer: Self-pay

## 2022-12-11 ENCOUNTER — Other Ambulatory Visit (HOSPITAL_COMMUNITY): Payer: Self-pay

## 2022-12-11 ENCOUNTER — Observation Stay (HOSPITAL_COMMUNITY)
Admission: EM | Admit: 2022-12-11 | Discharge: 2022-12-13 | Disposition: A | Discharge status: Home / Self Care | Payer: Medicaid Other | Attending: Family Medicine | Admitting: Family Medicine

## 2022-12-11 ENCOUNTER — Other Ambulatory Visit: Payer: Self-pay

## 2022-12-11 ENCOUNTER — Emergency Department (HOSPITAL_COMMUNITY): Payer: Medicaid Other

## 2022-12-11 DIAGNOSIS — N898 Other specified noninflammatory disorders of vagina: Secondary | ICD-10-CM | POA: Diagnosis not present

## 2022-12-11 DIAGNOSIS — Z79899 Other long term (current) drug therapy: Secondary | ICD-10-CM | POA: Insufficient documentation

## 2022-12-11 DIAGNOSIS — J441 Chronic obstructive pulmonary disease with (acute) exacerbation: Principal | ICD-10-CM | POA: Insufficient documentation

## 2022-12-11 DIAGNOSIS — J9601 Acute respiratory failure with hypoxia: Secondary | ICD-10-CM | POA: Diagnosis not present

## 2022-12-11 DIAGNOSIS — Z87891 Personal history of nicotine dependence: Secondary | ICD-10-CM | POA: Insufficient documentation

## 2022-12-11 DIAGNOSIS — Z1152 Encounter for screening for COVID-19: Secondary | ICD-10-CM | POA: Diagnosis not present

## 2022-12-11 DIAGNOSIS — R0602 Shortness of breath: Secondary | ICD-10-CM | POA: Diagnosis present

## 2022-12-11 DIAGNOSIS — F419 Anxiety disorder, unspecified: Secondary | ICD-10-CM | POA: Diagnosis present

## 2022-12-11 DIAGNOSIS — E876 Hypokalemia: Secondary | ICD-10-CM | POA: Insufficient documentation

## 2022-12-11 LAB — I-STAT VENOUS BLOOD GAS, ED
Acid-Base Excess: 0 mmol/L (ref 0.0–2.0)
Bicarbonate: 24.1 mmol/L (ref 20.0–28.0)
Calcium, Ion: 1.14 mmol/L — ABNORMAL LOW (ref 1.15–1.40)
HCT: 42 % (ref 36.0–46.0)
Hemoglobin: 14.3 g/dL (ref 12.0–15.0)
O2 Saturation: 83 %
Potassium: 3.2 mmol/L — ABNORMAL LOW (ref 3.5–5.1)
Sodium: 139 mmol/L (ref 135–145)
TCO2: 25 mmol/L (ref 22–32)
pCO2, Ven: 38.7 mmHg — ABNORMAL LOW (ref 44–60)
pH, Ven: 7.403 (ref 7.25–7.43)
pO2, Ven: 47 mmHg — ABNORMAL HIGH (ref 32–45)

## 2022-12-11 LAB — CBC
HCT: 42.3 % (ref 36.0–46.0)
Hemoglobin: 13.9 g/dL (ref 12.0–15.0)
MCH: 30.4 pg (ref 26.0–34.0)
MCHC: 32.9 g/dL (ref 30.0–36.0)
MCV: 92.6 fL (ref 80.0–100.0)
Platelets: 253 10*3/uL (ref 150–400)
RBC: 4.57 MIL/uL (ref 3.87–5.11)
RDW: 12.2 % (ref 11.5–15.5)
WBC: 5.9 10*3/uL (ref 4.0–10.5)
nRBC: 0 % (ref 0.0–0.2)

## 2022-12-11 LAB — RESP PANEL BY RT-PCR (RSV, FLU A&B, COVID)  RVPGX2
Influenza A by PCR: NEGATIVE
Influenza B by PCR: NEGATIVE
Resp Syncytial Virus by PCR: NEGATIVE
SARS Coronavirus 2 by RT PCR: NEGATIVE

## 2022-12-11 MED ORDER — ALPRAZOLAM 0.25 MG PO TABS
0.5000 mg | ORAL_TABLET | Freq: Once | ORAL | Status: AC
  Administered 2022-12-11: 0.5 mg via ORAL
  Filled 2022-12-11: qty 2

## 2022-12-11 MED ORDER — POTASSIUM CHLORIDE 20 MEQ PO PACK
40.0000 meq | PACK | Freq: Once | ORAL | Status: AC
  Administered 2022-12-11: 40 meq via ORAL
  Filled 2022-12-11: qty 2

## 2022-12-11 MED ORDER — IPRATROPIUM-ALBUTEROL 0.5-2.5 (3) MG/3ML IN SOLN: 3.0000 mL | Freq: Four times a day (QID) | RESPIRATORY_TRACT | Status: DC | PRN

## 2022-12-11 MED ORDER — PREDNISONE 20 MG PO TABS
40.0000 mg | ORAL_TABLET | Freq: Every day | ORAL | Status: DC
  Administered 2022-12-12 – 2022-12-13 (×2): 40 mg via ORAL
  Filled 2022-12-11 (×2): qty 2

## 2022-12-11 MED ORDER — ALPRAZOLAM 0.5 MG PO TABS: 0.5000 mg | ORAL_TABLET | Freq: Two times a day (BID) | ORAL | Status: DC | PRN

## 2022-12-11 MED ORDER — UMECLIDINIUM BROMIDE 62.5 MCG/ACT IN AEPB
1.0000 | INHALATION_SPRAY | Freq: Every day | RESPIRATORY_TRACT | Status: DC
  Administered 2022-12-12 – 2022-12-13 (×2): 1 via RESPIRATORY_TRACT
  Filled 2022-12-11: qty 7

## 2022-12-11 MED ORDER — BUPRENORPHINE HCL-NALOXONE HCL 2-0.5 MG SL SUBL
2.0000 | SUBLINGUAL_TABLET | Freq: Two times a day (BID) | SUBLINGUAL | Status: DC
  Administered 2022-12-11 – 2022-12-13 (×4): 2 via SUBLINGUAL
  Filled 2022-12-11 (×4): qty 2

## 2022-12-11 MED ORDER — ACETAMINOPHEN 325 MG PO TABS
650.0000 mg | ORAL_TABLET | Freq: Four times a day (QID) | ORAL | Status: DC | PRN
  Administered 2022-12-11 – 2022-12-13 (×2): 650 mg via ORAL
  Filled 2022-12-11 (×2): qty 2

## 2022-12-11 MED ORDER — FLUTICASONE FUROATE-VILANTEROL 100-25 MCG/ACT IN AEPB
1.0000 | INHALATION_SPRAY | Freq: Every day | RESPIRATORY_TRACT | Status: DC
  Administered 2022-12-12 – 2022-12-13 (×2): 1 via RESPIRATORY_TRACT
  Filled 2022-12-11: qty 28

## 2022-12-11 MED ORDER — ENOXAPARIN SODIUM 40 MG/0.4ML IJ SOSY
40.0000 mg | PREFILLED_SYRINGE | Freq: Every day | INTRAMUSCULAR | Status: DC
  Administered 2022-12-11 – 2022-12-13 (×3): 40 mg via SUBCUTANEOUS
  Filled 2022-12-11 (×3): qty 0.4

## 2022-12-11 MED ORDER — ALPRAZOLAM 0.5 MG PO TABS
0.5000 mg | ORAL_TABLET | Freq: Two times a day (BID) | ORAL | Status: DC | PRN
  Administered 2022-12-11: 0.5 mg via ORAL
  Filled 2022-12-11: qty 1

## 2022-12-11 MED ORDER — ALBUTEROL SULFATE (2.5 MG/3ML) 0.083% IN NEBU
10.0000 mg/h | INHALATION_SOLUTION | RESPIRATORY_TRACT | Status: DC
  Administered 2022-12-11: 10 mg/h via RESPIRATORY_TRACT
  Filled 2022-12-11: qty 12

## 2022-12-11 MED ORDER — IPRATROPIUM-ALBUTEROL 0.5-2.5 (3) MG/3ML IN SOLN
3.0000 mL | Freq: Four times a day (QID) | RESPIRATORY_TRACT | Status: DC
  Administered 2022-12-11 – 2022-12-12 (×3): 3 mL via RESPIRATORY_TRACT
  Filled 2022-12-11 (×3): qty 3

## 2022-12-11 MED ORDER — ONDANSETRON 4 MG PO TBDP
4.0000 mg | ORAL_TABLET | Freq: Three times a day (TID) | ORAL | Status: DC | PRN
  Administered 2022-12-11 – 2022-12-13 (×4): 4 mg via ORAL
  Filled 2022-12-11 (×4): qty 1

## 2022-12-11 MED ORDER — LORAZEPAM 0.5 MG PO TABS
0.5000 mg | ORAL_TABLET | Freq: Two times a day (BID) | ORAL | Status: DC | PRN
  Administered 2022-12-11: 0.5 mg via ORAL
  Filled 2022-12-11: qty 1

## 2022-12-11 NOTE — Progress Notes (Signed)
FMTS Brief Progress Note  S: Patient seen with Dr. Joelyn Oms, sitting up in bed. Patient reports improved breathing after breathing treatment, reports feeling less SOB. Reports decreased anxiety with improved breathing and getting PRN ativan.   O: BP 120/81 (BP Location: Right Arm)   Pulse 96   Temp 97.9 F (36.6 C) (Oral)   Resp 16   SpO2 97%   GEN: in no acute distress, on O2 RESP: inspiratory and expiratory wheezing bilaterally throughout.  CARDIO: RRR. No m/r/g.  A/P: COPD exacerbation Continues to have inspiratory and expiratory wheezing but reassuring that patient feels like breathing has improved.  -plan per day team.   Anxiety  -given PRN ativan -can also give additional PRN xanax if needed.   - Orders reviewed. Labs for AM ordered, which was adjusted as needed.   Rolanda Lundborg, MD 12/11/2022, 8:41 PM PGY-1, [facility] Night Resident  Please page [phone] with questions.

## 2022-12-11 NOTE — ED Notes (Signed)
ED TO INPATIENT HANDOFF REPORT  ED Nurse Name and Phone #: Richardson Landry [phone]  S Name/Age/Gender Tami Butler 62 y.o. female Room/Bed: 002C/002C  Code Status   Code Status: Full Code  Home/SNF/Other Home Patient oriented to: self, place, time, and situation Is this baseline? Yes   Triage Complete: Triage complete  Chief Complaint COPD exacerbation ([place]) [J44.1]  Triage Note Hx of copd, ran out of albuterol inhaler last night. Increased shortness of breath over the past few days. 2x duonebs and 1 albuterol breathing treatment given by EMS. 2 grams of magnesium and '125mg'$  solu-medrol given by EMS   Allergies No Known Allergies  Level of Care/Admitting Diagnosis ED Disposition     ED Disposition  Admit   Condition  --   [place]: [place] [[id number]]  Level of Care: Med-Surg [16]  May place patient in observation at [facility] or [place] if equivalent level of care is available:: No  Covid Evaluation: Asymptomatic - no recent exposure (last 10 days) testing not required  Diagnosis: COPD exacerbation [facility]) [id number]  Admitting Physician: NEAL, [place]  Attending Physician: Dickie La [[id number]]          B Medical/Surgery History Past Medical History:  Diagnosis Date   Arthritis    COPD (chronic obstructive pulmonary disease) ([place])    Goiter    Migraines    Past Surgical History:  Procedure Laterality Date   CESAREAN SECTION     x4   TUMOR REMOVAL       A IV Location/Drains/Wounds Patient Lines/Drains/Airways Status     Active Line/Drains/Airways     Name Placement date Placement time Site Days   Peripheral IV 09/02/21 20 G 1" Anterior;Right Forearm 09/02/21  1641  Forearm  465            Intake/Output Last 24 hours No intake or output data in the 24 hours ending 12/11/22 1437  Labs/Imaging Results for orders placed or performed during the hospital encounter of 12/11/22 (from the past 48 hour(s))   I-Stat venous blood gas, ([facility], [facility], [facility])     Status: Abnormal   Collection Time: 12/11/22 12:15 PM  Result Value Ref Range   pH, Ven 7.403 7.25 - 7.43   pCO2, Ven 38.7 (L) 44 - 60 mmHg   pO2, Ven 47 (H) 32 - 45 mmHg   Bicarbonate 24.1 20.0 - 28.0 mmol/L   TCO2 25 22 - 32 mmol/L   O2 Saturation 83 %   Acid-Base Excess 0.0 0.0 - 2.0 mmol/L   Sodium 139 135 - 145 mmol/L   Potassium 3.2 (L) 3.5 - 5.1 mmol/L   Calcium, Ion 1.14 (L) 1.15 - 1.40 mmol/L   HCT 42.0 36.0 - 46.0 %   Hemoglobin 14.3 12.0 - 15.0 g/dL   Sample type VENOUS   Resp panel by RT-PCR (RSV, Flu A&B, Covid) Anterior Nasal Swab     Status: None   Collection Time: 12/11/22  1:02 PM   Specimen: Anterior Nasal Swab  Result Value Ref Range   SARS Coronavirus 2 by RT PCR NEGATIVE NEGATIVE   Influenza A by PCR NEGATIVE NEGATIVE   Influenza B by PCR NEGATIVE NEGATIVE    Comment: (NOTE) The Xpert Xpress SARS-CoV-2/FLU/RSV plus assay is intended as an aid in the diagnosis of influenza from Nasopharyngeal swab specimens and should not be used as a sole basis for treatment. Nasal washings and aspirates are unacceptable for Xpert Xpress SARS-CoV-2/FLU/RSV testing.  Fact Sheet for Patients: EntrepreneurPulse.com.au  Fact Sheet for Healthcare Providers: IncredibleEmployment.be  This test is not yet approved or cleared by the [place] FDA and has been authorized for detection and/or diagnosis of SARS-CoV-2 by FDA under an Emergency Use Authorization (EUA). This EUA will remain in effect (meaning this test can be used) for the duration of the COVID-19 declaration under Section 564(b)(1) of the Act, 21 U.S.C. section 360bbb-3(b)(1), unless the authorization is terminated or revoked.     Resp Syncytial Virus by PCR NEGATIVE NEGATIVE    Comment: (NOTE) Fact Sheet for Patients: EntrepreneurPulse.com.au  Fact Sheet for Healthcare  Providers: IncredibleEmployment.be  This test is not yet approved or cleared by the [place] FDA and has been authorized for detection and/or diagnosis of SARS-CoV-2 by FDA under an Emergency Use Authorization (EUA). This EUA will remain in effect (meaning this test can be used) for the duration of the COVID-19 declaration under Section 564(b)(1) of the Act, 21 U.S.C. section 360bbb-3(b)(1), unless the authorization is terminated or revoked.  Performed at [facility], [place] [address], [place], [place] [address]   CBC     Status: None   Collection Time: 12/11/22  1:25 PM  Result Value Ref Range   WBC 5.9 4.0 - 10.5 K/uL   RBC 4.57 3.87 - 5.11 MIL/uL   Hemoglobin 13.9 12.0 - 15.0 g/dL   HCT 42.3 36.0 - 46.0 %   MCV 92.6 80.0 - 100.0 fL   MCH 30.4 26.0 - 34.0 pg   MCHC 32.9 30.0 - 36.0 g/dL   RDW 12.2 11.5 - 15.5 %   Platelets 253 150 - 400 K/uL   nRBC 0.0 0.0 - 0.2 %    Comment: Performed at [facility], [place] [address] [place], [place] [address]   DG Chest Port 1 View  Result Date: 12/11/2022 CLINICAL DATA:  Cough, dyspnea EXAM: PORTABLE CHEST 1 VIEW COMPARISON:  Previous studies including the examination of 09/02/2021 FINDINGS: Cardiac size is within normal limits. Lung fields are clear of any infiltrates or pulmonary edema. There is no pleural effusion or pneumothorax. Calcified granulomas are seen in left hilum and medial left lower lung field. IMPRESSION: There are no new infiltrates or signs of pulmonary edema. Electronically Signed   By: Elmer Picker M.D.   On: 12/11/2022 12:20    Pending Labs Unresulted Labs (From admission, onward)     Start     Ordered   12/12/22 [id number]  Basic metabolic panel  Tomorrow morning,   R        12/11/22 1422   12/11/22 1419  HIV Antibody (routine testing w rflx)  (HIV Antibody (Routine testing w reflex) panel)  Once,   R        12/11/22 1422            Vitals/Pain Today's Vitals   12/11/22  1300 12/11/22 1313 12/11/22 1330 12/11/22 1400  BP: 112/74 133/82  (!) 132/91  Pulse: (!) 127 (!) 139 (!) 126 (!) 130  Resp: '20 19 18 19  '$ Temp:      TempSrc:      SpO2: 99% (!) 87% 92% 92%  PainSc:        Isolation Precautions Airborne and Contact precautions  Medications Medications  albuterol (PROVENTIL) (2.5 MG/3ML) 0.083% nebulizer solution (10 mg/hr Nebulization New Bag/Given 12/11/22 1213)  buprenorphine-naloxone (SUBOXONE) 2-0.5 mg per SL tablet 2 tablet (has no administration in time range)  enoxaparin (LOVENOX) injection 40 mg (  has no administration in time range)  umeclidinium bromide (INCRUSE ELLIPTA) 62.5 MCG/ACT 1 puff (has no administration in time range)  fluticasone furoate-vilanterol (BREO ELLIPTA) 100-25 MCG/ACT 1 puff (has no administration in time range)  predniSONE (DELTASONE) tablet 40 mg (has no administration in time range)  ipratropium-albuterol (DUONEB) 0.5-2.5 (3) MG/3ML nebulizer solution 3 mL (has no administration in time range)  ipratropium-albuterol (DUONEB) 0.5-2.5 (3) MG/3ML nebulizer solution 3 mL (has no administration in time range)  potassium chloride (KLOR-CON) packet 40 mEq (has no administration in time range)  ALPRAZolam (XANAX) tablet 0.5 mg (0.5 mg Oral Given 12/11/22 1327)    Mobility walks     Focused Assessments Pulmonary Assessment Handoff:  Lung sounds: L Breath Sounds: Diminished R Breath Sounds: Diminished O2 Device: Nasal Cannula O2 Flow Rate (L/min): 2 L/min    R Recommendations: See Admitting Provider Note  Report given to:   Additional Notes:

## 2022-12-11 NOTE — ED Notes (Signed)
Patient requested to use restroom. Patient provided with bedside commode. Patient encouraged to stay in room and use commode due to her chief complaint.

## 2022-12-11 NOTE — Progress Notes (Signed)
Pt states that she has been taking Xanax 0.'5mg'$  BID for 20 years.  Pt also said that the Ativan is not working and experiencing nausea and vomiting..  Pt would like the Xanax back.

## 2022-12-11 NOTE — Assessment & Plan Note (Addendum)
Endorses persistent anxiety but states the Xanax helps. -Xanax 0.'5mg'$  TID prn

## 2022-12-11 NOTE — ED Triage Notes (Signed)
Hx of copd, ran out of albuterol inhaler last night. Increased shortness of breath over the past few days. 2x duonebs and 1 albuterol breathing treatment given by EMS. 2 grams of magnesium and '125mg'$  solu-medrol given by EMS

## 2022-12-11 NOTE — Assessment & Plan Note (Addendum)
Now on RA. Continues to have intermittent coughing fits but feels improved. -Continue Breo and Incruse (Breztri alternative) -Xopenex-Atrovent nebs sch q6h given tachycardia -Dounebs prn q6h -Continue Prednisone '40mg'$  x 5 days (end on 3/8) -Sch Mucinex '600mg'$  BID

## 2022-12-11 NOTE — H&P (Addendum)
Hospital Admission History and Physical Service Pager: [phone]  Patient name: Tami Butler Medical record number: [id number] Date of Birth: 1961-06-02 Age: 62 y.o. Gender: female  Primary Care Provider: Kerin Perna, NP Consultants: None Code Status: Full Code  Preferred Emergency Contact: Salomon Mast (Daughter) [phone]  Chief Complaint: SOB  Assessment and Plan: Tami Butler is a 62 y.o. female presenting with shortness of breath and wheezing . Differential for this patient's presentation of this includes COPD exacerbation (h/o poorly controlled COPD, not on maintenance), PNA (CXR negative for focal consolidation, denies fever, purulent sputum), PE (does not explain wheezing) and HF exacerbation (no know heart disease, no crackles or hypervolemia). PMHx includes COPD, migraines, depression and anxiety.  * COPD exacerbation (HCC) Increased wheezing and SOB x 3 days. Denies fever, increased sputum production and purulent sputum. H/o COPD, unable to afford Breztri maintinence. Reports taking albuterol 2 puffs 5-6 times daily but ran out last night. Unknown trigger but exacerbation appears to be secondary to lack of COPD maintenance. Received Solumedrol, Mag and Duonebs x 3 via EMS. Mild improvement in the ED on Albuterol '10mg'$  neg. CXR negative for acute process, showed calcified granulomas. Vitals stable, increased work of breathing and diffuse wheezing with prolonged expiratory phase on 2L [place]. Tachycardia most likely secondary to albuterol use. -Admitted to FMTS, Dr. Nori Riis attending -Start on [place] and Keenan Bachelor Judithann Sauger alternative) -Duonebs q6h sch with prn q6h -Continue Prednisone '40mg'$  -Consider abx although does not meet criteria at this time -Wean oxygen as tolerated  Anxiety Chronic, not currently on medication but self medications with marijuana edibles 2-3 times per week. S/p Xanax in the ED for management with some improvement. Reports her anxiety worsens her  dyspnea in a biofeedback loop. -Consider Hydroxyzine as need for management   Hypokalemia K was 3.2 on admission. Cardiac monitoring show Sinus tach and patient denies any palpitation.  -Replete with 53mq KCL -Monitor with BMP  Chronic conditions: Opioid use disorder: Continue Suboxone '4mg'$  BID   FEN/GI: Regular VTE Prophylaxis: Lovenox  Disposition: Med surg  History of Present Illness:  Tami CERVENKAis a 62y.o. female presenting with worsening SOB and wheezing x 3 days. States she has not had insurance until recently and has been out of BMurphy Oilinhaler for about 6 months. Currently taking Albuterol 2 puffs, 5-6 times daily but ran out last night. Reports her underlying anxiety worsens her breathing. Denies fever, sick contact or congestion. Endorses cough with brownish phlem. Former smoker with 20 pack year history, stopped smoking 2 years ago. Of note, has had multiple prior hospitalization for COPD exacerbation.   [facility] and prescribed her BJudithann Saugerbut unable to get BJudithann Saugeryet d/t no insurance.  In the ED, patient received Solumedrol, Mag and Duonebs x3 in route to the hospital. In the ED she received Albuterol '10mg'$  nebulizer with some improvement but continued to wheeze. Requiring 2L [place] to maintain oxygen saturation.  Review Of Systems: Per HPI above  Pertinent Past Medical History: COPD Former tobacco abuse Migraines Depression and anxiety Remainder reviewed in history tab.   Pertinent Past Surgical History: C/s x 4 Tube removal Remainder reviewed in history tab.   Pertinent Social History: Tobacco use: Former, 20 pack year history Alcohol use: None Other Substance use: Marijuana edibles 2-3 times per week Lives with daughter, son in law and two kids.  Pertinent Family History: Mother: lymphoma Remainder reviewed in history tab.  Important Outpatient Medications: Albuterol neb 2.'5mg'$  prn Breztri  160-9-4.8 2 puffs BID Wellbutrin XL '150mg'$  daily Mucinex '600mg'$  BID Melatonin '5mg'$  Dulera 2 puffs BID Suboxone 4-1 BID Remainder reviewed in medication history.   Objective: BP (!) 132/91   Pulse (!) 130   Temp (!) 97.2 F (36.2 C) (Axillary)   Resp 19   SpO2 92%  Exam: General: Speaking in short sentences with raspy inhalation between words, alert, NAD Eyes: Anicteric sclera ENTM: Dry lips. Moist oral mucosa Neck: Supple, non-tender Cardiovascular: Tachycardia. Regular rhythm. No murmur Respiratory: Increased WOB on 2L, speaking in short sentences, Diffuse expiratory wheezing throughout with prolonged expiratory phase Gastrointestinal: Soft, non-tender, non-distended MSK: No peripheral edema Derm: Warm, dry, no rashes noted Neuro: CN intact. Motor and sensation intact globally Psych: Cooperative, pleasant   Labs:  CBC BMET  Recent Labs  Lab 12/11/22 1325  WBC 5.9  HGB 13.9  HCT 42.3  PLT 253   Recent Labs  Lab 12/11/22 1215  NA 139  K 3.2*     Pertinent additional labs: VBG: 7.403, 38.7, 47, 24.1  EKG: Sinus tachycardia   Imaging Studies Performed: DG Chest Port 1 View Result Date: 12/11/2022 IMPRESSION: There are no new infiltrates or signs of pulmonary edema.   Colletta Maryland, MD 12/11/2022, 3:19 PM PGY-1, [place] Intern pager: [phone], text pages welcome Secure chat group [place]   I was personally present and re-performed the exam. I verified the service and findings are accurately documented in the intern's note with my changes made within the note. Also will follow up with respiratory panel to rule out COPD exacerbation due to viral illness. Patient could have underlying bronchiospasm, will monitor her response to bronchodilators.   Alen Bleacher, MD 12/11/2022 3:37 PM

## 2022-12-11 NOTE — ED Provider Notes (Signed)
[facility] Provider Note   CSN: [id number] Arrival date & time: 12/11/22  1159     History  Chief Complaint  Patient presents with   Shortness of Breath    Tami Butler is a 62 y.o. female.  HPI 62 year old female history of COPD the presents today with increased dyspnea and wheezing of the past several days.  She states that she has not been able to get her combination inhaler filled for a while.  She has been using her albuterol rescue inhaler and has run out of this.  She has had increased dyspnea.  EMS responded today and gave her albuterol, Atrovent x 2 and albuterol 5 mg with magnesium and Solu-Medrol prehospital.  Patient continues to be short of breath with wheezing.  She denies any significant change in cough productivity, fever, chills.    Home Medications Prior to Admission medications   Medication Sig Start Date End Date Taking? Authorizing Provider  acetaminophen (TYLENOL) 500 MG tablet Take 1,000 mg by mouth every 6 (six) hours as needed for mild pain.    [provider]  albuterol (PROVENTIL) (2.5 MG/3ML) 0.083% nebulizer solution USE 1 VIAL BY NEBULIZATION EVERY FOUR HOURS AS NEEDED FOR WHEEZING OR SHORTNESS OF BREATH. 09/08/20 09/08/21  Kathie Dike, MD  albuterol (VENTOLIN HFA) 108 (90 Base) MCG/ACT inhaler INHALE 2 PUFFS INTO THE LUNGS EVERY SIX HOURS AS NEEDED FOR WHEEZING OR SHORTNESS OF BREATH. 09/08/20 09/08/21  Kathie Dike, MD  Budeson-Glycopyrrol-Formoterol (BREZTRI AEROSPHERE) 160-9-4.8 MCG/ACT AERO Inhale 2 puffs into the lungs 2 (two) times daily. 10/19/20   Margaretha Seeds, MD  Budeson-Glycopyrrol-Formoterol (BREZTRI AEROSPHERE) 160-9-4.8 MCG/ACT AERO Inhale 2 puffs into the lungs in the morning and at bedtime. 03/14/21   Margaretha Seeds, MD  buPROPion (WELLBUTRIN XL) 150 MG 24 hr tablet Take 1 tablet (150 mg total) by mouth daily. Patient not taking: Reported on 10/19/2020 09/20/20   Kerin Perna, NP  guaiFENesin (MUCINEX) 600 MG 12 hr tablet Take 1 tablet (600 mg total) by mouth 2 (two) times daily. Patient not taking: Reported on 10/19/2020 09/08/20   Kathie Dike, MD  Melatonin 5 MG CAPS Take 1 capsule (5 mg total) by mouth at bedtime as needed. Patient not taking: Reported on 10/19/2020 09/20/20   Kerin Perna, NP  mometasone-formoterol (DULERA) 200-5 MCG/ACT AERO INHALE 2 PUFFS INTO THE LUNGS TWO TIMES DAILY. 09/08/20 09/08/21  Kathie Dike, MD      Allergies    Patient has no known allergies.    Review of Systems   Review of Systems  Physical Exam Updated Vital Signs BP 133/82   Pulse (!) 126   Temp (!) 97.2 F (36.2 C) (Axillary)   Resp 18   SpO2 92%  Physical Exam Vitals reviewed.  Constitutional:      General: She is in acute distress.     Appearance: She is well-developed. She is ill-appearing.  HENT:     Head: Normocephalic.     Mouth/Throat:     Mouth: Mucous membranes are moist.  Eyes:     Pupils: Pupils are equal, round, and reactive to light.  Cardiovascular:     Rate and Rhythm: Regular rhythm. Tachycardia present.  Pulmonary:     Effort: Tachypnea present.     Breath sounds: Wheezing present.  Musculoskeletal:        General: Normal range of motion.     Cervical back: Normal range of motion.  Right lower leg: No tenderness. No edema.     Left lower leg: No tenderness. No edema.  Skin:    General: Skin is warm and dry.     Capillary Refill: Capillary refill takes less than 2 seconds.  Neurological:     General: No focal deficit present.     Mental Status: She is alert.  Psychiatric:        Mood and Affect: Mood normal.     ED Results / Procedures / Treatments   Labs (all labs ordered are listed, but only abnormal results are displayed) Labs Reviewed  I-STAT VENOUS BLOOD GAS, ED - Abnormal; Notable for the following components:      Result Value   pCO2, Ven 38.7 (*)    pO2, Ven 47 (*)    Potassium 3.2 (*)     Calcium, Ion 1.14 (*)    All other components within normal limits  RESP PANEL BY RT-PCR (RSV, FLU A&B, COVID)  RVPGX2  CBC    EKG EKG Interpretation  Date/Time:  Monday December 11 2022 12:06:48 EST Ventricular Rate:  123 PR Interval:  156 QRS Duration: 101 QT Interval:  285 QTC Calculation: 408 R Axis:   67 Text Interpretation: Sinus tachycardia LAE, consider biatrial enlargement Nonspecific repol abnormality, diffuse leads Confirmed by Pattricia Boss [phone]) on 12/11/2022 1:42:40 PM  Radiology DG Chest Port 1 View  Result Date: 12/11/2022 CLINICAL DATA:  Cough, dyspnea EXAM: PORTABLE CHEST 1 VIEW COMPARISON:  Previous studies including the examination of 09/02/2021 FINDINGS: Cardiac size is within normal limits. Lung fields are clear of any infiltrates or pulmonary edema. There is no pleural effusion or pneumothorax. Calcified granulomas are seen in left hilum and medial left lower lung field. IMPRESSION: There are no new infiltrates or signs of pulmonary edema. Electronically Signed   By: Elmer Picker M.D.   On: 12/11/2022 12:20    Procedures .Critical Care  Performed by: Pattricia Boss, MD Authorized by: Pattricia Boss, MD   Critical care provider statement:    Critical care time (minutes):  60   Critical care was necessary to treat or prevent imminent or life-threatening deterioration of the following conditions:  Respiratory failure   Critical care was time spent personally by me on the following activities:  Development of treatment plan with patient or surrogate, discussions with consultants, evaluation of patient's response to treatment, examination of patient, ordering and review of laboratory studies, ordering and review of radiographic studies, ordering and performing treatments and interventions, pulse oximetry, re-evaluation of patient's condition and review of old charts     Medications Ordered in ED Medications  albuterol (PROVENTIL) (2.5 MG/3ML) 0.083% nebulizer  solution (10 mg/hr Nebulization New Bag/Given 12/11/22 1213)  ALPRAZolam (XANAX) tablet 0.5 mg (0.5 mg Oral Given 12/11/22 1327)    ED Course/ Medical Decision Making/ A&P Clinical Course as of 12/11/22 1348  Mon Dec 11, 2022  1335 X-Adante [place] reviewed interpreted no evidence of acute infiltrates, pneumothorax or other acute abnormalities noted on my interpretation and radiologist interpretation concurs [DR]  [place] reviewed interpreted sinus tachycardia with some diffuse nonspecific ST changes [DR]  1343 VBG obtained pH 7.40 hemoglobin 14, mild hypokalemia with potassium of 3.2 [DR]    Clinical Course User Index [DR] Pattricia Boss, MD                             Medical Decision Making Amount and/or Complexity of Data Reviewed  Labs: ordered. Radiology: ordered.  Risk Prescription drug management.   Presents today in acute respiratory distress with diffuse wheezing.  Patient was treated prehospital with magnesium, Solu-Medrol, albuterol and Atrovent.  Here in the ED she received an additional albuterol 10 mg nebulizer. Patient's oxygen saturations have been in the upper 80s.  She is placed on nasal cannula and sats have increased into the mid 90s.  VBG obtained with normal pH and pCO2 Xanax 0.5 ordered Plan admit for going evaluation and treatment Discussed with admitting service.  Patient appears appropriate for stepdown        Final Clinical Impression(s) / ED Diagnoses Final diagnoses:  COPD exacerbation ([place])  Acute respiratory failure with hypoxia [facility])    Rx / DC Orders ED Discharge Orders     None         Pattricia Boss, MD 12/11/22 1348

## 2022-12-11 NOTE — TOC Benefit Eligibility Note (Signed)
Patient Teacher, English as a foreign language completed.    The patient is currently admitted and upon discharge could be taking Spiriva .  The current 30 day co-pay is $4.00.   The patient is currently admitted and upon discharge could be taking Breztri .  Requires Prior Authorization  The patient is currently admitted and upon discharge could be taking Trelegy .  Requires Prior Authorization  The patient is insured through [employer] [place] Medicaid   Lyndel Safe, [place] Patient Advocate Specialist [place] Patient Advocate Team Direct Number: [phone]  Fax: [phone]

## 2022-12-12 ENCOUNTER — Other Ambulatory Visit (HOSPITAL_COMMUNITY): Payer: Self-pay

## 2022-12-12 ENCOUNTER — Observation Stay (HOSPITAL_COMMUNITY): Payer: Medicaid Other

## 2022-12-12 DIAGNOSIS — J441 Chronic obstructive pulmonary disease with (acute) exacerbation: Secondary | ICD-10-CM | POA: Diagnosis not present

## 2022-12-12 LAB — BASIC METABOLIC PANEL
Anion gap: 10 (ref 5–15)
BUN: 14 mg/dL (ref 6–20)
CO2: 25 mmol/L (ref 22–32)
Calcium: 9.7 mg/dL (ref 8.9–10.3)
Chloride: 101 mmol/L (ref 98–111)
Creatinine, Ser: 0.83 mg/dL (ref 0.44–1.00)
GFR, Estimated: >60 mL/min (ref >60)
Glucose, Bld: 96 mg/dL (ref 70–99)
Potassium: 3.7 mmol/L (ref 3.5–5.1)
Sodium: 136 mmol/L (ref 135–145)

## 2022-12-12 MED ORDER — GUAIFENESIN ER 600 MG PO TB12
600.0000 mg | ORAL_TABLET | Freq: Two times a day (BID) | ORAL | Status: DC | PRN
  Administered 2022-12-12: 600 mg via ORAL
  Filled 2022-12-12: qty 1

## 2022-12-12 MED ORDER — GUAIFENESIN ER 600 MG PO TB12
600.0000 mg | ORAL_TABLET | Freq: Two times a day (BID) | ORAL | Status: DC
  Administered 2022-12-12 – 2022-12-13 (×2): 600 mg via ORAL
  Filled 2022-12-12 (×2): qty 1

## 2022-12-12 MED ORDER — BENZONATATE 100 MG PO CAPS
100.0000 mg | ORAL_CAPSULE | Freq: Three times a day (TID) | ORAL | Status: DC | PRN
  Administered 2022-12-12 – 2022-12-13 (×4): 100 mg via ORAL
  Filled 2022-12-12 (×4): qty 1

## 2022-12-12 MED ORDER — IPRATROPIUM BROMIDE 0.02 % IN SOLN
0.5000 mg | Freq: Four times a day (QID) | RESPIRATORY_TRACT | Status: DC
  Administered 2022-12-12 – 2022-12-13 (×3): 0.5 mg via RESPIRATORY_TRACT
  Filled 2022-12-12 (×4): qty 2.5

## 2022-12-12 MED ORDER — LORAZEPAM 2 MG/ML IJ SOLN
1.0000 mg | Freq: Once | INTRAMUSCULAR | Status: AC
  Administered 2022-12-12: 1 mg via INTRAVENOUS
  Filled 2022-12-12: qty 1

## 2022-12-12 MED ORDER — ALPRAZOLAM 0.5 MG PO TABS
0.5000 mg | ORAL_TABLET | Freq: Three times a day (TID) | ORAL | Status: DC | PRN
  Administered 2022-12-12 – 2022-12-13 (×4): 0.5 mg via ORAL
  Filled 2022-12-12 (×4): qty 1

## 2022-12-12 MED ORDER — LEVALBUTEROL HCL 0.63 MG/3ML IN NEBU
0.6300 mg | INHALATION_SOLUTION | Freq: Four times a day (QID) | RESPIRATORY_TRACT | Status: DC
  Administered 2022-12-12 – 2022-12-13 (×2): 0.63 mg via RESPIRATORY_TRACT
  Filled 2022-12-12 (×6): qty 3

## 2022-12-12 NOTE — Progress Notes (Addendum)
     Daily Progress Note Intern Pager: [phone]  Patient name: Tami Butler Medical record number: [id number] Date of birth: 1961/02/20 Age: 62 y.o. Gender: female  Primary Care Provider: Kerin Perna, NP Consultants: None Code Status: Full code  Pt Overview and Major Events to Date:  3/4: Admitted to FMTS  Assessment and Plan: Tami Butler is a 62 y.o. female presenting with shortness of breath and wheezing in the setting of a COPD exacerbation. PMHx includes COPD, migraines, depression and anxiety.   * COPD exacerbation (HCC) Intermittent coughing fits with increase work of breathing which trigger anxiety, particularly after breathing treatments. Reports difficulty clearing mucus. Denies increase in sputum production or purulent sputum. Unable to tolerate CXR due to dyspnea this AM. Per Pharmacy, patient with $8 copay to continue [place] for maintenance outpatient. -Continue Breo and Incruse (Breztri alternative) -Switch to Xopenex-Atrovent nebs sch q6h given tachycardia -Dounebs prn q6h -Continue Prednisone '40mg'$  x 5 days -Consider abx although does not meet criteria at this time -Wean oxygen as tolerated -Pending repeat CXR -Sch Mucinex '600mg'$  BID  Anxiety Cyclical anxiety symptoms worsened with coughing fits. Reports baseline increase anxiety. -Increase to Xanax 0.'5mg'$  TID prn   Chronic conditions: Chronic pain syndrome: Continue Suboxone '4mg'$  BID   FEN/GI: Regular PPx: Lovenox Dispo: Home pending respiratory improvement  Subjective:  Patient assessed at bedside, sitting up in bed in some respiratory distress. Patient began coughing repeatedly and subsequently breathing rapidly. States she feels like mucus is stuck in her chest and she cannot bring it up. Especial after breathing treatment. States her anxiety is very high right now and the Xanax helps but she feels it in between doses. States she is trying to drink but has not had an appetite, some  nausea.  Objective: Temp:  [97.2 F (36.2 C)-98.4 F (36.9 C)] 97.7 F (36.5 C) (03/05 0820) Pulse Rate:  [92-139] 123 (03/05 0820) Resp:  [16-28] 18 (03/05 0748) BP: (101-133)/(74-102) 101/86 (03/05 0820) SpO2:  [87 %-100 %] 99 % (03/05 0820) FiO2 (%):  [28 %] 28 % (03/05 0750) Physical Exam: General: Sitting at edge of bed, mild respiratory distress after coughing that resolved throughout exam Cardiovascular: Tachycardia. Regular rhythm. No murmur Respiratory: Mild expiratory wheeze diffusely. Prolonged expiratory phase. Tachypnea on 2L [place]. Intermittent coughing fits. Abdomen: Soft, non-tender, non-distended Extremities: No peripheral edema  Laboratory: Most recent CBC Lab Results  Component Value Date   WBC 5.9 12/11/2022   HGB 13.9 12/11/2022   HCT 42.3 12/11/2022   MCV 92.6 12/11/2022   PLT 253 12/11/2022   Most recent BMP    Latest Ref Rng & Units 12/11/2022   12:15 PM  BMP  Sodium 135 - 145 mmol/L 139   Potassium 3.5 - 5.1 mmol/L 3.2     Other pertinent labs: none   Imaging/Diagnostic Tests: DG Chest Port 1 View Result Date: 12/11/2022 IMPRESSION: There are no new infiltrates or signs of pulmonary edema.     Colletta Maryland, MD 12/12/2022, 10:54 AM  PGY-1, [place] Intern pager: [phone], text pages welcome Secure chat group [place]

## 2022-12-12 NOTE — Plan of Care (Signed)

## 2022-12-12 NOTE — Hospital Course (Addendum)
Tami Butler is a 62 y.o.female with a history of COPD, migraines, depression and anxiety who was admitted to the [facility] Medicine Teaching Service at [facility] for dyspnea in the setting of COPD exacerbation. Her hospital course is detailed below:  COPD exacerbation Anxiety Admitted for dyspnea and wheezing x 3 days in the setting of uncontrolled COPD. Unable to afford Breztri, only on Albuterol rescue inhaler at home. Received Solumedrol, Mag, Duonebs and Albuterol nebs prior to admission. Required 2L [place] to maintain O2 sat. Started on Breo/Incurse triple therapy with plans to discharge on Dulera/Spirvia due to insurance coverage. Transitioned to Xopenex due to tachycardia with albuterol worsening anxiety symptoms. Prednisone '40mg'$  x 5 day total course. Did not require antibiotics given no increase in sputum production or purulent sputum. She was started on Xanax 0.'5mg'$  due to anxiety worsening underlying breathing and sent home with 3-day bridge until outpatient appointment. Stable and weaned to RA prior to discharge.  Vaginal discharge Endorsed increased white discharge with malodor x 1 week. States similar to prior BV infections. Patient self swabbed and results were pending at time of discharge.  Other chronic conditions were medically managed with home medications and formulary alternatives as necessary (chronic pain)  PCP Follow-up Recommendations: Start Dulera + Spiriva for daily control at discharge Follow-up self swab for Yeast/BV

## 2022-12-13 ENCOUNTER — Other Ambulatory Visit (HOSPITAL_COMMUNITY): Payer: Self-pay

## 2022-12-13 DIAGNOSIS — J441 Chronic obstructive pulmonary disease with (acute) exacerbation: Secondary | ICD-10-CM | POA: Diagnosis not present

## 2022-12-13 DIAGNOSIS — N898 Other specified noninflammatory disorders of vagina: Secondary | ICD-10-CM | POA: Insufficient documentation

## 2022-12-13 DIAGNOSIS — J9601 Acute respiratory failure with hypoxia: Secondary | ICD-10-CM

## 2022-12-13 LAB — CERVICOVAGINAL ANCILLARY ONLY
Bacterial Vaginitis (gardnerella): POSITIVE — AB
Candida Glabrata: NEGATIVE
Candida Vaginitis: NEGATIVE
Comment: NEGATIVE
Comment: NEGATIVE
Comment: NEGATIVE

## 2022-12-13 LAB — BASIC METABOLIC PANEL
Anion gap: 8 (ref 5–15)
BUN: 9 mg/dL (ref 6–20)
CO2: 28 mmol/L (ref 22–32)
Calcium: 9.3 mg/dL (ref 8.9–10.3)
Chloride: 100 mmol/L (ref 98–111)
Creatinine, Ser: 0.77 mg/dL (ref 0.44–1.00)
GFR, Estimated: >60 mL/min (ref >60)
Glucose, Bld: 89 mg/dL (ref 70–99)
Potassium: 3.8 mmol/L (ref 3.5–5.1)
Sodium: 136 mmol/L (ref 135–145)

## 2022-12-13 LAB — HIV ANTIBODY (ROUTINE TESTING W REFLEX): HIV Screen 4th Generation wRfx: NONREACTIVE

## 2022-12-13 MED ORDER — GUAIFENESIN ER 600 MG PO TB12
600.0000 mg | ORAL_TABLET | Freq: Two times a day (BID) | ORAL | 0 refills | Status: DC
  Filled 2022-12-13 (×2): qty 20, 10d supply, fill #0

## 2022-12-13 MED ORDER — LEVALBUTEROL TARTRATE 45 MCG/ACT IN AERO: 1.0000 | INHALATION_SPRAY | RESPIRATORY_TRACT | 2 refills | Status: AC | PRN

## 2022-12-13 MED ORDER — MOMETASONE FURO-FORMOTEROL FUM 200-5 MCG/ACT IN AERO
2.0000 | INHALATION_SPRAY | Freq: Two times a day (BID) | RESPIRATORY_TRACT | 0 refills | Status: AC
  Filled 2022-12-13: qty 13, 30d supply, fill #0

## 2022-12-13 MED ORDER — LEVALBUTEROL TARTRATE 45 MCG/ACT IN AERO
1.0000 | INHALATION_SPRAY | RESPIRATORY_TRACT | 2 refills | Status: DC | PRN
  Filled 2022-12-13 (×2): qty 15, 30d supply, fill #0

## 2022-12-13 MED ORDER — ALPRAZOLAM 0.5 MG PO TABS
0.5000 mg | ORAL_TABLET | Freq: Three times a day (TID) | ORAL | 0 refills | Status: AC | PRN
  Filled 2022-12-13: qty 9, 3d supply, fill #0

## 2022-12-13 MED ORDER — PREDNISONE 20 MG PO TABS
40.0000 mg | ORAL_TABLET | Freq: Every day | ORAL | 0 refills | Status: AC
  Filled 2022-12-13: qty 4, 2d supply, fill #0

## 2022-12-13 MED ORDER — ONDANSETRON 4 MG PO TBDP
4.0000 mg | ORAL_TABLET | Freq: Three times a day (TID) | ORAL | 0 refills | Status: DC | PRN
  Filled 2022-12-13: qty 20, 7d supply, fill #0

## 2022-12-13 MED ORDER — SPIRIVA RESPIMAT 2.5 MCG/ACT IN AERS
2.0000 | INHALATION_SPRAY | Freq: Every day | RESPIRATORY_TRACT | 0 refills | Status: AC
  Filled 2022-12-13: qty 4, 30d supply, fill #0

## 2022-12-13 MED ORDER — BENZONATATE 100 MG PO CAPS
100.0000 mg | ORAL_CAPSULE | Freq: Three times a day (TID) | ORAL | 0 refills | Status: DC | PRN
  Filled 2022-12-13: qty 20, 7d supply, fill #0

## 2022-12-13 NOTE — Plan of Care (Signed)
  Problem: Education: Goal: Knowledge of General Education information will improve Description: Including pain rating scale, medication(s)/side effects and non-pharmacologic comfort measures Outcome: Progressing   Problem: Activity: Goal: Risk for activity intolerance will decrease Outcome: Progressing   Problem: Nutrition: Goal: Adequate nutrition will be maintained Outcome: Progressing   Problem: Coping: Goal: Level of anxiety will decrease Outcome: Progressing   Problem: Pain Managment: Goal: General experience of comfort will improve Outcome: Progressing   Problem: Safety: Goal: Ability to remain free from injury will improve Outcome: Progressing   Problem: Skin Integrity: Goal: Risk for impaired skin integrity will decrease Outcome: Progressing   Problem: Clinical Measurements: Goal: Respiratory complications will improve Outcome: Progressing

## 2022-12-13 NOTE — Plan of Care (Signed)

## 2022-12-13 NOTE — Progress Notes (Signed)
8 pm: Pt sitting bedside, coughing vigorously, nauseated and states she has vomited x2 past hour. Pt very congested, emesis has large mucous plugs, small amount of digested food. Pt states she threw up xanax and tessalon pills. Emesis in blue bag, no pills noted. Pt extremely anxious, trembling. Pt states she feels very dehydrated. Pt asking about IVF.  Contacted MD for additional antiemetic so pt can drink PO without throwing up as pt states"everytime I drink, it comes right back up". MD Lurline Hare to bedside, 1 x '1mg'$  Ativan ordered and administered.     Pt calmly resting in bed, no coughing noted-pt states coughing much better, no anxiety and able to keep down PO. Pt administered Mucinex and suboxone.

## 2022-12-13 NOTE — Progress Notes (Signed)
     Daily Progress Note Intern Pager: [phone]  Patient name: Tami Butler Medical record number: [id number] Date of birth: [birth year] Age: 62 y.o. Gender: female  Primary Care Provider: Kerin Perna, NP Consultants: None Code Status: Full code   Pt Overview and Major Events to Date:  3/4: Admitted to FMTS   Assessment and Plan: Tami Butler is a 62 y.o. female presenting with shortness of breath and wheezing in the setting of a COPD exacerbation. PMHx includes COPD, migraines, depression and anxiety.   * COPD exacerbation ([place]) Now on RA. Continues to have intermittent coughing fits but feels improved. -Continue Breo and Incruse (Breztri alternative) -Xopenex-Atrovent nebs sch q6h given tachycardia -Dounebs prn q6h -Continue Prednisone '40mg'$  x 5 days (end on 3/8) -Sch Mucinex '600mg'$  BID  Vaginal discharge Reports white, malodorous discharge x 1 week. Denies itching. States she has a h/o of BV and she feels this is a recurrence. -Vaginal self swab for BV & Candida  Anxiety Endorses persistent anxiety but states the Xanax helps. -Xanax 0.'5mg'$  TID prn   Chronic conditions: Chronic pain syndrome: Continue Suboxone '4mg'$  BID  FEN/GI: Regular PPx: Lovenox Dispo: Home pending respiratory improvement  Subjective:  Patient assessed at bedside, states she is feeling better. Forgot to put on her oxygen and did not need it this AM. States she still has some coughing fits the worsen breathing. Endorses increased white vaginal discharge with odor that has been consist with BV she has had in the past. Wants to make sure we send her with anxiety meds since she is out at home.  Objective: Temp:  [97.5 F (36.4 C)-98.5 F (36.9 C)] 97.7 F (36.5 C) (03/06 0838) Pulse Rate:  [84-112] 88 (03/06 0838) Resp:  [16-19] 19 (03/06 0838) BP: (94-123)/(69-90) 116/76 (03/06 0838) SpO2:  [94 %-100 %] 100 % (03/06 0838) FiO2 (%):  [28 %] 28 % (03/05 1404) Weight:  [49.7 kg] 49.7 kg  (03/05 2113) Physical Exam: General: Sitting up in bed. NAD Cardiovascular: RRR. No murmur Respiratory: Mild expiratory wheeze diffusely. Prolonged expiratory phase. Normal WOB on RA. Abdomen: Soft, non-tender, non-distended Extremities: No peripheral edema  Laboratory: Most recent CBC Lab Results  Component Value Date   WBC 5.9 12/11/2022   HGB 13.9 12/11/2022   HCT 42.3 12/11/2022   MCV 92.6 12/11/2022   PLT 253 12/11/2022   Most recent BMP    Latest Ref Rng & Units 12/13/2022    8:13 AM  BMP  Glucose 70 - 99 mg/dL 89   BUN 6 - 20 mg/dL 9   Creatinine 0.44 - 1.00 mg/dL 0.77   Sodium 135 - 145 mmol/L 136   Potassium 3.5 - 5.1 mmol/L 3.8   Chloride 98 - 111 mmol/L 100   CO2 22 - 32 mmol/L 28   Calcium 8.9 - 10.3 mg/dL 9.3     Other pertinent labs: none  Imaging/Diagnostic Tests: DG Chest 2 View Result Date: 12/12/2022 IMPRESSION: No active disease. Chronic interstitial lung markings. Chronic calcified lymph nodes in the hila and mediastinum.  Colletta Maryland, MD 12/13/2022, 12:05 PM  PGY-1, [place] Intern pager: [phone], text pages welcome Secure chat group [place]

## 2022-12-13 NOTE — Assessment & Plan Note (Signed)
Reports white, malodorous discharge x 1 week. Denies itching. States she has a h/o of BV and she feels this is a recurrence. -Vaginal self swab for BV & Candida

## 2022-12-13 NOTE — Discharge Instructions (Addendum)
Dear Tami Butler,   Thank you for letting us participate in your care! In this section, you will find a brief hospital admission summary of why you were admitted to the hospital, what happened during your admission, your diagnosis/diagnoses, and recommended follow up.  Primary diagnosis: COPD exacerbation Treatment plan: We treated your breathing difficulties with steroids, breathing treatments and oxygen. Please be aware we are discharging you on Dulera and Spiriva which should be taken daily for maintenance of your COPD. You are also being sent home with Xopenex which is an alternative to Albuterol that does not cause a fast heart rate to use as needed. Please finish your course of steroids that will be done on 3/8.   POST-HOSPITAL & CARE INSTRUCTIONS We recommend following up with your PCP within 1 week from being discharged from the hospital. Please let PCP/Specialists know of any changes in medications that were made which you will be able to see in the medications section of this packet.   Thank you for choosing [facility]! Take care and be well!  [facility]  [place], [place] [address] [phone]

## 2022-12-13 NOTE — Discharge Summary (Cosign Needed)
[facility] Discharge Summary  Patient name: Tami Butler Medical record number: [id number] Date of birth: [birth year] Age: 62 y.o. Gender: female Date of Admission: 12/11/2022  Date of Discharge: 12/13/2022 Admitting Physician: Dickie La, MD  Primary Care Provider: Kerin Perna, NP Consultants: None  Indication for Hospitalization: COPD exacerbation  Discharge Diagnoses/Problem List:  Principal Problem for Admission: COPD exacerbation Other Problems addressed during stay:  Principal Problem:   COPD exacerbation [facility]) Active Problems:   Anxiety   Vaginal discharge   Acute respiratory failure with hypoxia [facility])   Brief Hospital Course:  Tami Butler is a 63 y.o.female with a history of COPD, migraines, depression and anxiety who was admitted to the [facility] Medicine Teaching Service at [facility] for dyspnea in the setting of COPD exacerbation. Her hospital course is detailed below:  COPD exacerbation Anxiety Admitted for dyspnea and wheezing x 3 days in the setting of uncontrolled COPD. Unable to afford Breztri, only on Albuterol rescue inhaler at home. Received Solumedrol, Mag, Duonebs and Albuterol nebs prior to admission. Required 2L [place] to maintain O2 sat. Started on Breo/Incurse triple therapy with plans to discharge on Dulera/Spirvia due to insurance coverage. Transitioned to Xopenex due to tachycardia with albuterol worsening anxiety symptoms. Prednisone '40mg'$  x 5 day total course. Did not require antibiotics given no increase in sputum production or purulent sputum. She was started on Xanax 0.'5mg'$  due to anxiety worsening underlying breathing and sent home with 3-day bridge until outpatient appointment. Stable and weaned to RA prior to discharge.  Vaginal discharge Endorsed increased white discharge with malodor x 1 week. States similar to prior BV infections. Patient self swabbed and results were pending at time of discharge.  Other chronic  conditions were medically managed with home medications and formulary alternatives as necessary (chronic pain)  PCP Follow-up Recommendations: Start Dulera + Spiriva for daily control at discharge Follow-up self swab for Yeast/BV  Disposition: Home  Discharge Condition: Stable  Discharge Exam:  Vitals:   12/13/22 0825 12/13/22 0838  BP:  116/76  Pulse: 84 88  Resp: 16 19  Temp:  97.7 F (36.5 C)  SpO2: 94% 100%   General: Sitting up in bed. NAD Cardiovascular: RRR. No murmur Respiratory: Mild expiratory wheeze diffusely. Prolonged expiratory phase. Normal WOB on RA. Abdomen: Soft, non-tender, non-distended Extremities: No peripheral edema  Significant Procedures: None  Significant Labs and Imaging:  No results for input(s): "WBC", "HGB", "HCT", "PLT" in the last 48 hours. Recent Labs  Lab 12/12/22 0956 12/13/22 0813  NA 136 136  K 3.7 3.8  CL 101 100  CO2 25 28  GLUCOSE 96 89  BUN 14 9  CREATININE 0.83 0.77  CALCIUM 9.7 9.3    Pertinent Imaging: DG Chest 2 View Result Date: 12/12/2022 IMPRESSION: No active disease. Chronic interstitial lung markings. Chronic calcified lymph nodes in the hila and mediastinum.   DG Chest Port 1 View Result Date: 12/11/2022 IMPRESSION: There are no new infiltrates or signs of pulmonary edema.  Results/Tests Pending at Time of Discharge: Vaginal swab for BV & Candida  Discharge Medications:  Allergies as of 12/13/2022   No Known Allergies      Medication List     STOP taking these medications    albuterol (2.5 MG/3ML) 0.083% nebulizer solution Commonly known as: PROVENTIL   albuterol 108 (90 Base) MCG/ACT inhaler Commonly known as: VENTOLIN HFA   Breztri Aerosphere 160-9-4.8 MCG/ACT Aero Generic drug: Budeson-Glycopyrrol-Formoterol   buPROPion 150 MG  24 hr tablet Commonly known as: Wellbutrin XL   Melatonin 5 MG Caps       TAKE these medications    acetaminophen 500 MG tablet Commonly known as: TYLENOL Take  1,000 mg by mouth every 6 (six) hours as needed for mild pain.   ALPRAZolam 0.5 MG tablet Commonly known as: XANAX Take 1 tablet (0.5 mg total) by mouth 3 (three) times daily as needed for up to 3 days for anxiety.   benzonatate 100 MG capsule Commonly known as: TESSALON Take 1 capsule (100 mg total) by mouth 3 (three) times daily as needed for cough.   Buprenorphine HCl-Naloxone HCl 8-2 MG Film Place 1 Film under the tongue daily.   Dulera 200-5 MCG/ACT Aero Generic drug: mometasone-formoterol INHALE 2 PUFFS INTO THE LUNGS TWO TIMES DAILY.   levalbuterol 45 MCG/ACT inhaler Commonly known as: XOPENEX HFA Inhale 1 puff into the lungs every 4 (four) hours as needed for wheezing.   Mucus Relief 600 MG 12 hr tablet Generic drug: guaiFENesin Take 1 tablet (600 mg total) by mouth 2 (two) times daily.   ondansetron 4 MG disintegrating tablet Commonly known as: ZOFRAN-ODT Take 1 tablet (4 mg total) by mouth every 8 (eight) hours as needed for nausea or vomiting.   predniSONE 20 MG tablet Commonly known as: DELTASONE Take 2 tablets (40 mg total) by mouth daily with breakfast for 2 days. Start taking on: December 14, 2022   Spiriva Respimat 2.5 MCG/ACT Aers Generic drug: Tiotropium Bromide Monohydrate Inhale 2 puffs into the lungs daily.        Discharge Instructions: Please refer to Patient Instructions section of EMR for full details.  Patient was counseled important signs and symptoms that should prompt return to medical care, changes in medications, dietary instructions, activity restrictions, and follow up appointments.   Follow-Up Appointments:  Follow-up Information     Kerin Perna, NP. Schedule an appointment as soon as possible for a visit.   Specialty: Internal Medicine Why: Please schedule an appointment at your earliest convenience for a hospital follow up. Contact information: [place] [address] [phone]                  Colletta Maryland, MD 12/13/2022, 5:10 PM PGY-1, [place]

## 2022-12-14 ENCOUNTER — Other Ambulatory Visit (INDEPENDENT_AMBULATORY_CARE_PROVIDER_SITE_OTHER): Payer: Self-pay | Admitting: Primary Care

## 2022-12-14 ENCOUNTER — Telehealth: Payer: Self-pay | Admitting: Family Medicine

## 2022-12-14 ENCOUNTER — Telehealth (INDEPENDENT_AMBULATORY_CARE_PROVIDER_SITE_OTHER): Payer: Self-pay

## 2022-12-14 MED ORDER — METRONIDAZOLE 500 MG PO TABS: 500.0000 mg | ORAL_TABLET | Freq: Two times a day (BID) | ORAL | 0 refills | Status: DC

## 2022-12-14 NOTE — Transitions of Care (Post Inpatient/ED Visit) (Signed)
   12/14/2022  Name: Tami Butler MRN: [id number] DOB: [birth year]  Today's TOC FU Call Status: Today's TOC FU Call Status:: Unsuccessul Call (1st Attempt) Unsuccessful Call (1st Attempt) Date: 12/14/22  Attempted to reach the patient regarding the most recent Inpatient/ED visit.  Follow Up Plan: Additional outreach attempts will be made to reach the patient to complete the Transitions of Care (Post Inpatient/ED visit) call.   [place] LPN [place] Advisor Direct Dial [phone]

## 2022-12-14 NOTE — Telephone Encounter (Signed)
Test result discussed. +BV on wet prep. She asked that her meds be sent to [facility]. She has not further questions or concerns.

## 2022-12-18 ENCOUNTER — Other Ambulatory Visit (HOSPITAL_COMMUNITY): Payer: Self-pay

## 2022-12-18 NOTE — Transitions of Care (Post Inpatient/ED Visit) (Unsigned)
   12/18/2022  Name: Tami Butler MRN: [id number] DOB: [birth year]  Today's TOC FU Call Status: Today's TOC FU Call Status:: Unsuccessful Call (2nd Attempt) Unsuccessful Call (1st Attempt) Date: 12/14/22 Unsuccessful Call (2nd Attempt) Date: 12/18/22  Attempted to reach the patient regarding the most recent Inpatient/ED visit.  Follow Up Plan: Additional outreach attempts will be made to reach the patient to complete the Transitions of Care (Post Inpatient/ED visit) call.   [place] LPN [place] Advisor Direct Dial [phone]

## 2022-12-19 NOTE — Transitions of Care (Post Inpatient/ED Visit) (Signed)
   12/19/2022  Name: Tami Butler MRN: [id number] DOB: 1961-08-12  Today's TOC FU Call Status: Today's TOC FU Call Status:: Unsuccessful Call (3rd Attempt) Unsuccessful Call (1st Attempt) Date: 12/14/22 Unsuccessful Call (2nd Attempt) Date: 12/18/22 Unsuccessful Call (3rd Attempt) Date: 12/19/22  Attempted to reach the patient regarding the most recent Inpatient/ED visit.  Follow Up Plan: No further outreach attempts will be made at this time. We have been unable to contact the patient.  Pt has fu appt with PCP   Signature  Juanda Crumble LPN [facility] Nurse Health Advisor Direct Dial [phone]

## 2022-12-28 ENCOUNTER — Ambulatory Visit (INDEPENDENT_AMBULATORY_CARE_PROVIDER_SITE_OTHER): Payer: Medicaid Other | Admitting: Primary Care

## 2022-12-28 ENCOUNTER — Encounter (INDEPENDENT_AMBULATORY_CARE_PROVIDER_SITE_OTHER): Payer: Self-pay | Admitting: Primary Care

## 2022-12-28 VITALS — BP 130/78 | HR 95 | Resp 16 | Ht 60.0 in | Wt 103.8 lb

## 2022-12-28 DIAGNOSIS — Z1231 Encounter for screening mammogram for malignant neoplasm of breast: Secondary | ICD-10-CM

## 2022-12-28 DIAGNOSIS — Z1211 Encounter for screening for malignant neoplasm of colon: Secondary | ICD-10-CM

## 2022-12-28 DIAGNOSIS — E46 Unspecified protein-calorie malnutrition: Secondary | ICD-10-CM

## 2022-12-28 DIAGNOSIS — Z122 Encounter for screening for malignant neoplasm of respiratory organs: Secondary | ICD-10-CM

## 2022-12-28 DIAGNOSIS — Z09 Encounter for follow-up examination after completed treatment for conditions other than malignant neoplasm: Secondary | ICD-10-CM

## 2022-12-28 NOTE — Progress Notes (Signed)
[facility]   Subjective:   Tami Butler is a 62 y.o. female presents for hospital follow up . Patient present to the ED with dyspnea and COPD exacerbation home tx was not working she only had a rescue inhaler and was not able to afford Breztri . Admit date to the hospital was 12/11/22, patient was discharged from the hospital on 12/13/22, patient was admitted for: Anxiety ,COPD exacerbation , Vaginal discharge and Acute respiratory failure with hypoxia . Today she is feeling better and inhaler were changed in the hospital and given to her. Patient has No headache, No chest pain, No abdominal pain - No Nausea, No new weakness tingling or numbness, No Cough - shortness of breath  Past Medical History:  Diagnosis Date   Arthritis    COPD (chronic obstructive pulmonary disease) ([place])    Goiter    Migraines      No Known Allergies    Current Outpatient Medications on File Prior to Visit  Medication Sig Dispense Refill   acetaminophen (TYLENOL) 500 MG tablet Take 1,000 mg by mouth every 6 (six) hours as needed for mild pain.     benzonatate (TESSALON) 100 MG capsule Take 1 capsule (100 mg total) by mouth 3 (three) times daily as needed for cough. (Patient not taking: Reported on 12/28/2022) 20 capsule 0   Buprenorphine HCl-Naloxone HCl 8-2 MG FILM Place 1 Film under the tongue daily.     guaiFENesin (MUCINEX) 600 MG 12 hr tablet Take 1 tablet (600 mg total) by mouth 2 (two) times daily. (Patient not taking: Reported on 12/28/2022) 20 tablet 0   levalbuterol (XOPENEX HFA) 45 MCG/ACT inhaler Inhale 1 puff into the lungs every 4 (four) hours as needed for wheezing. 1 each 2   metroNIDAZOLE (FLAGYL) 500 MG tablet Take 1 tablet (500 mg total) by mouth 2 (two) times daily. (Patient not taking: Reported on 12/28/2022) 14 tablet 0   mometasone-formoterol (DULERA) 200-5 MCG/ACT AERO INHALE 2 PUFFS INTO THE LUNGS TWO TIMES DAILY. 13 g 0   ondansetron (ZOFRAN-ODT) 4 MG disintegrating tablet  Take 1 tablet (4 mg total) by mouth every 8 (eight) hours as needed for nausea or vomiting. (Patient not taking: Reported on 12/28/2022) 20 tablet 0   Tiotropium Bromide Monohydrate (SPIRIVA RESPIMAT) 2.5 MCG/ACT AERS Inhale 2 puffs into the lungs daily. 4 g 0   No current facility-administered medications on file prior to visit.     Review of System: Comprehensive ROS Pertinent positive and negative noted in HPI    Objective:  Blood Pressure 130/78   Pulse 95   Respiration 16   Height 5' (1.524 m)   Weight 103 lb 12.8 oz (47.1 kg)   Oxygen Saturation 100%   Body Mass Index 20.27 kg/m   Filed Weights   12/28/22 1500  Weight: 103 lb 12.8 oz (47.1 kg)    Physical Exam: General Appearance: malnourished, in no apparent distress. Eyes: PERRLA, EOMs, conjunctiva no swelling or erythema Sinuses: No Frontal/maxillary tenderness ENT/Mouth: Ext aud canals clear, TMs without erythema, bulging. No erythema, swelling, or exudate on post pharynx.  Tonsils not swollen or erythematous. Hearing normal.  Neck: Supple, thyroid normal.  Respiratory: Respiratory effort normal, BS wheezing all quadrants - stated took her medication Cardio: RRR with no MRGs. Brisk peripheral pulses without edema.  Abdomen: Soft, + BS.  Non tender, no guarding, rebound, hernias, masses. Lymphatics: Non tender without lymphadenopathy.  Musculoskeletal: Full ROM, 5/5 strength, normal gait.  Skin: Warm, dry without  rashes, lesions, ecchymosis.  Neuro: Cranial nerves intact. Normal muscle tone, no cerebellar symptoms. Sensation intact.  Psych: Awake and oriented X 3, normal affect, Insight and Judgment appropriate.    Assessment:  [place] was seen today for hospitalization follow-up.  Diagnoses and all orders for this visit:  Malnutrition related to chronic disease ([place]) Body mass index is 20.27 kg/m. F/u will ck   Serum proteins (albumin, transferrin, prealbumin, retinol-binding protein)  Hospital discharge  follow-up No additional orders f/u with pcp  Colon cancer screening -     Ambulatory referral to Gastroenterology  Encounter for screening mammogram for malignant neoplasm of breast -     MM DIGITAL SCREENING BILATERAL; Future  Screening for lung cancer -     CT CHEST LUNG CA SCREEN LOW DOSE W/O CM; Future     This note has been created with Surveyor, quantity. Any transcriptional errors are unintentional.   Kerin Perna, NP 12/28/2022, 3:06 PM

## 2023-03-02 ENCOUNTER — Ambulatory Visit (INDEPENDENT_AMBULATORY_CARE_PROVIDER_SITE_OTHER): Payer: Medicaid Other | Admitting: Pulmonary Disease

## 2023-03-02 ENCOUNTER — Encounter (HOSPITAL_BASED_OUTPATIENT_CLINIC_OR_DEPARTMENT_OTHER): Payer: Self-pay | Admitting: Pulmonary Disease

## 2023-03-02 VITALS — BP 114/70 | HR 110 | Temp 98.3°F | Ht 60.0 in | Wt 116.8 lb

## 2023-03-02 DIAGNOSIS — J449 Chronic obstructive pulmonary disease, unspecified: Secondary | ICD-10-CM | POA: Diagnosis not present

## 2023-03-02 MED ORDER — ALBUTEROL SULFATE (2.5 MG/3ML) 0.083% IN NEBU: 2.5000 mg | INHALATION_SOLUTION | Freq: Four times a day (QID) | RESPIRATORY_TRACT | 12 refills | Status: AC | PRN

## 2023-03-02 NOTE — Patient Instructions (Signed)
Severe COPD, GOLD Class C/D - overall controlled --CONTINUE Dulera 200-5 mcg TWO puffs TWICE a day.  --CONTINUE Spiriva handinhaler ONE puff ONCE a day. Inhaler technique was assessed. --CONTINUE Albuterol as needed

## 2023-03-02 NOTE — Progress Notes (Signed)
Subjective:   PATIENT ID: Tami Butler GENDER: female DOB: 1961/07/29, MRN: [id number]   HPI  Chief Complaint  Patient presents with   Follow-up    Follow up. Patient stated she is doing better.     Reason for Visit: Follow-up  Ms. Tami Butler is a 62 year old female former smoker with probably COPD who presents for follow-up.  Synopsis She was recently admitted from 09/05/20-09/08/20 for acute hypoxemic respiratory failure secondary to presumed COPD exacerbation. She was treated with steroids, bronchodilators and antibiotics. Pulmonary inpatient team was consulted. We recommended discharge home with [facility] and [facility]. She has tolerated these inhalers well and has noticed a significant improvement in her symptoms. She did not require oxygen at discharge. She has not needed additional nebulizer treatment since being home. Denies cough, wheezing. Has dyspnea on exertion but only with heavy activity. At home, she is helping her daughter with their newborn.  03/02/23 Lost to follow-up for >1 year. She now has insurance and is currently on Spiriva handinhaler and [employer]. Will use albuterol once a day 6 days a week. Has shortness of breath exacerbated by anxiety. Denies productive cough or wheezing. Her last COPD exacerbation in 12/2022 due to running out of her medications. Patient reports she is scheduled for a PET scan however unclear why she needs this.  Social History: 38 pack years. Quit in 07/2020.  Past Medical History:  Diagnosis Date   Arthritis    COPD (chronic obstructive pulmonary disease) (H[facility])    Goiter    Migraines      Family History  Problem Relation Age of Onset   Lymphoma Mother    Dementia Father    Cancer Other      Social History   Occupational History   Occupation: unemployed  Tobacco Use   Smoking status: Former    Packs/day: 1.00    Years: 38.00    Additional pack years: 0.00    Total pack years: 38.00    Types: Cigarettes    Quit date:  07/2020    Years since quitting: 2.6   Smokeless tobacco: Never  Substance and Sexual Activity   Alcohol use: Not Currently    Comment: occ   Drug use: Yes    Types: Marijuana    Comment: occasional    Sexual activity: Not on file    No Known Allergies   Outpatient Medications Prior to Visit  Medication Sig Dispense Refill   acetaminophen (TYLENOL) 500 MG tablet Take 1,000 mg by mouth every 6 (six) hours as needed for mild pain.     ALPRAZolam (XANAX) 0.5 MG tablet Take 0.5 mg by mouth 3 (three) times daily.     ARIPiprazole (ABILIFY) 2 MG tablet Take 2 mg by mouth daily as needed.     Buprenorphine HCl-Naloxone HCl 8-2 MG FILM Place 1 Film under the tongue daily.     levalbuterol (XOPENEX HFA) 45 MCG/ACT inhaler Inhale 1 puff into the lungs every 4 (four) hours as needed for wheezing. 1 each 2   mirtazapine (REMERON) 15 MG tablet Take by mouth.     mometasone-formoterol (DULERA) 200-5 MCG/ACT AERO INHALE 2 PUFFS INTO THE LUNGS TWO TIMES DAILY. 13 g 0   Tiotropium Bromide Monohydrate (SPIRIVA RESPIMAT) 2.5 MCG/ACT AERS Inhale 2 puffs into the lungs daily. 4 g 0   No facility-administered medications prior to visit.    Review of Systems  Constitutional:  Negative for chills, diaphoresis, fever, malaise/fatigue and weight loss.  HENT:  Negative for congestion.   Respiratory:  Positive for shortness of breath. Negative for cough, hemoptysis, sputum production and wheezing.   Cardiovascular:  Negative for chest pain, palpitations and leg swelling.     Objective:   Vitals:   03/02/23 1539  BP: 114/70  Pulse: (!) 110  Temp: 98.3 F (36.8 C)  TempSrc: Oral  SpO2: 93%  Weight: 116 lb 12.8 oz (53 kg)  Height: 5' (1.524 m)   SpO2: 93 % O2 Device: None (Room air)   Body mass index is 22.81 kg/m.  Physical Exam: General: Well-appearing, no acute distress HENT: [place], AT Eyes: EOMI, no scleral icterus Respiratory: Clear to auscultation bilaterally.  No crackles, wheezing or  rales Cardiovascular: RRR, -M/R/G, no JVD Extremities:-Edema,-tenderness Neuro: AAO x4, CNII-XII grossly intact Psych: Normal mood, normal affect  Data Reviewed:  Imaging: CXR 09/08/20 - Hyperinflated lungs. Bronchitic thickening. No infiltrate, effusion or edema. CXR 12/12/22 - Chronic interstitial lung markings  PFT: 10/19/20 FVC 2.18 (72%) FEV1 1.15 (49%) Ratio 55  TLC 107% DLCO 81% Interpretation: Severe obstructive defect with air trapping. Normal gas exchange. No significant bronchodilator repsonse.  Labs: CBC    Component Value Date/Time   WBC 5.9 12/11/2022 1325   RBC 4.57 12/11/2022 1325   HGB 13.9 12/11/2022 1325   HCT 42.3 12/11/2022 1325   PLT 253 12/11/2022 1325   MCV 92.6 12/11/2022 1325   MCH 30.4 12/11/2022 1325   MCHC 32.9 12/11/2022 1325   RDW 12.2 12/11/2022 1325   LYMPHSABS 1.2 09/02/2021 1643   MONOABS 0.6 09/02/2021 1643   EOSABS 0.1 09/02/2021 1643   BASOSABS 0.0 09/02/2021 1643   Absolute eos 09/05/20 - 300  Assessment & Plan:   Discussion: 62 year old female for COPD follow-up. Mildly symptomatic on triple therapy but no limitations in activity. Anxiety will intermittently worsen it. Discussed clinical course and management of COPD including bronchodilator regimen, preventive care including vaccinations and action plan for exacerbation.  Severe COPD, GOLD Class C/D - overall controlled --CONTINUE Dulera 200-5 mcg TWO puffs TWICE a day.  --CONTINUE Spiriva handinhaler ONE puff ONCE a day. Inhaler technique was assessed. --CONTINUE Albuterol as needed   Health Maintenance Immunization History  Administered Date(s) Administered   Influenza,inj,Quad PF,6+ Mos 07/25/2016, 09/08/2020   CT Lung Screen - enrol.   Orders Placed This Encounter  Procedures   AMB REFERRAL FOR DME    Referral Priority:   Routine    Referral Type:   Durable Medical Equipment Purchase   Meds ordered this encounter  Medications   albuterol (PROVENTIL) (2.5 MG/3ML)  0.083% nebulizer solution    Sig: Take 3 mLs (2.5 mg total) by nebulization every 6 (six) hours as needed for wheezing or shortness of breath.    Dispense:  75 mL    Refill:  12    Return in about 6 months (around 09/02/2023).  I have spent a total time of 33-minutes on the day of the appointment including chart review, data review, collecting history, coordinating care and discussing medical diagnosis and plan with the patient/family. Past medical history, allergies, medications were reviewed. Pertinent imaging, labs and tests included in this note have been reviewed and interpreted independently by me.  Jaheem Hedgepath Mechele Collin, MD [facility] 03/02/2023 4:36 PM  Office Number [phone]

## 2023-03-08 IMAGING — DX DG CHEST 1V PORT
1 series · 1 of 1 positions shown · non-contrast
Comparison: September 08, 2020

CLINICAL DATA: Chest pain.

EXAM:
PORTABLE CHEST 1 VIEW

[chest ap]
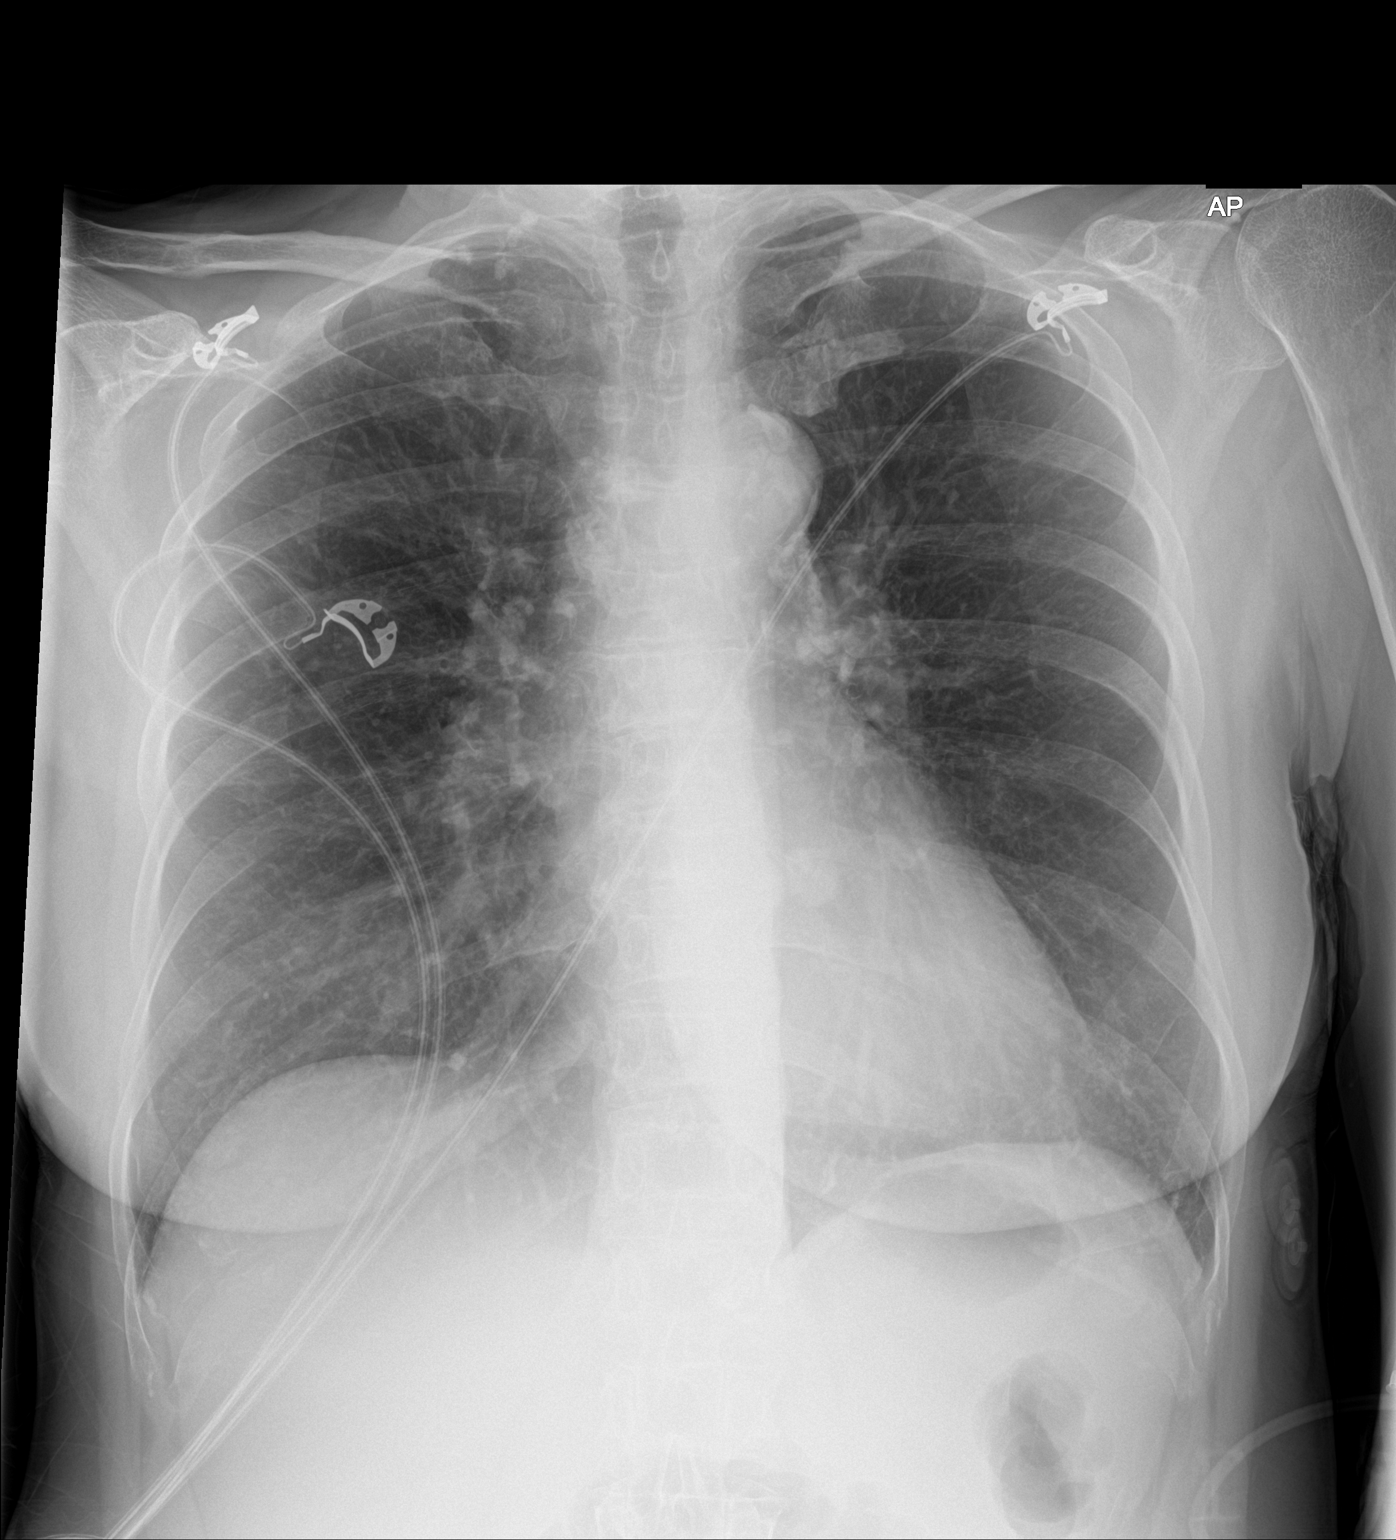

[1 of 1 positions shown; findings below may reference images not displayed]

FINDINGS: The heart size and mediastinal contours are within normal limits.
Both lungs are clear. The visualized skeletal structures are
unremarkable.
IMPRESSION: No active disease.

## 2023-03-08 IMAGING — CT CT HEAD W/O CM
4 series · 17 of 47 positions shown, 19 images · non-contrast
Comparison: 12/13/2014 CT

CLINICAL DATA: 60-year-old female with LEFT UPPER extremity and
LEFT facial numbness for 1 day.

EXAM:
CT HEAD WITHOUT CONTRAST
TECHNIQUE: Contiguous axial images were obtained from the base of the skull
through the vertex without intravenous contrast.

[Series 2: head wo · axial · 0.43mm/px · z∈[-432,-327]mm · 7 of 29 slices shown, 9 images]
[im 4/29  brain]
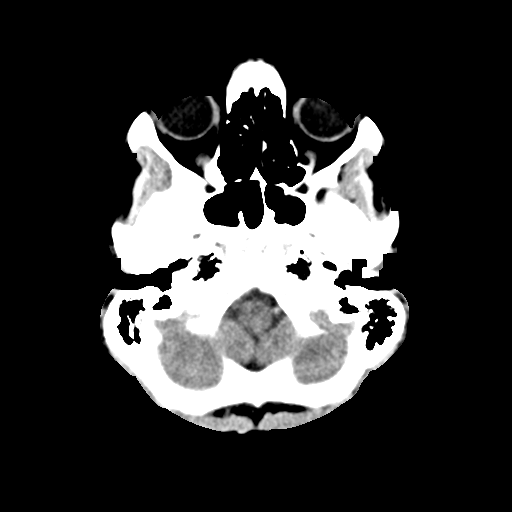
[im 4/29  bone]
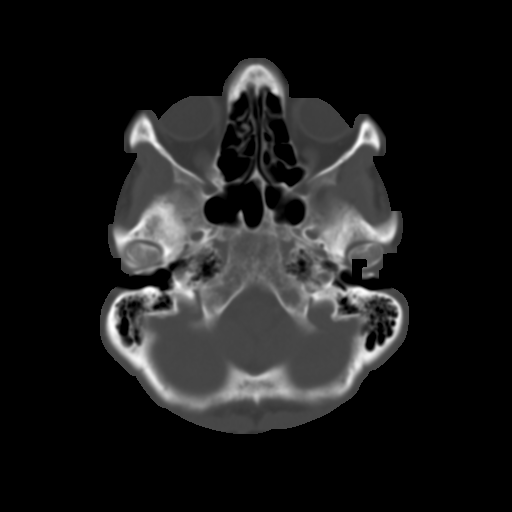
[im 8/29  brain]
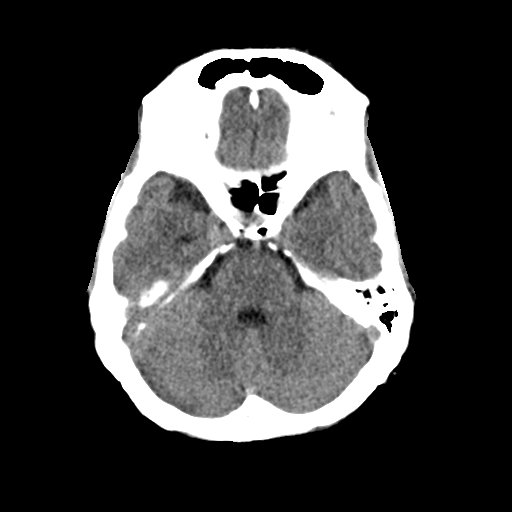
[im 11/29  brain]
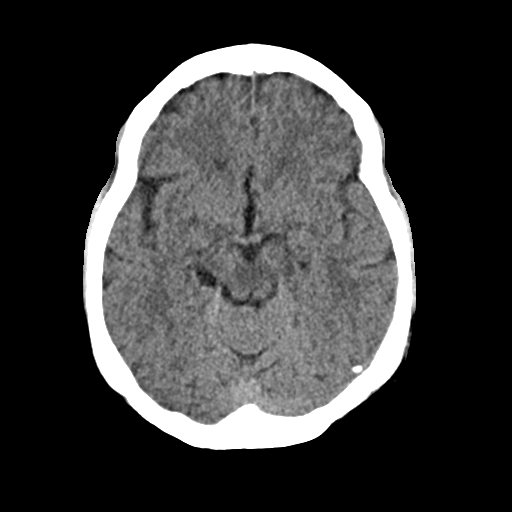
[im 15/29  brain]
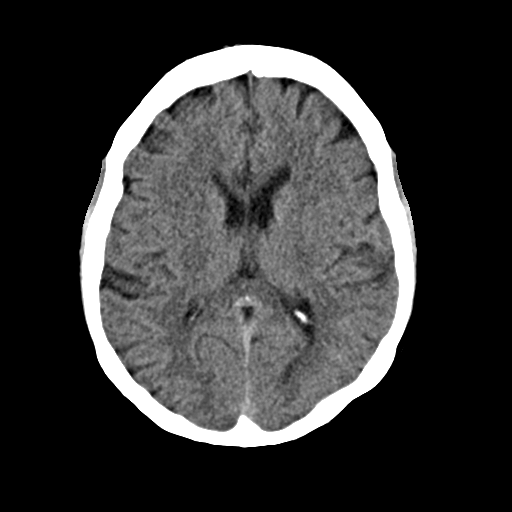
[im 18/29  brain]
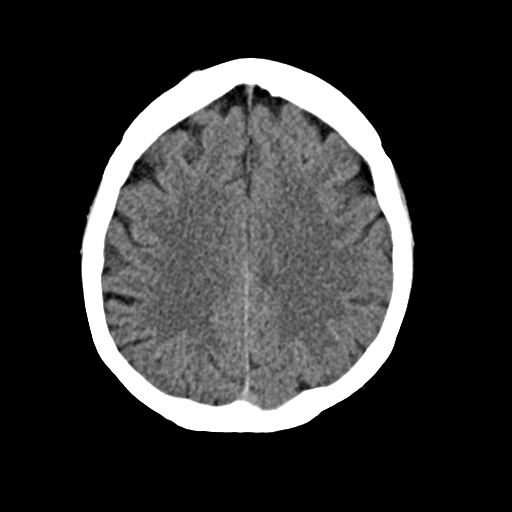
[im 18/29  bone]
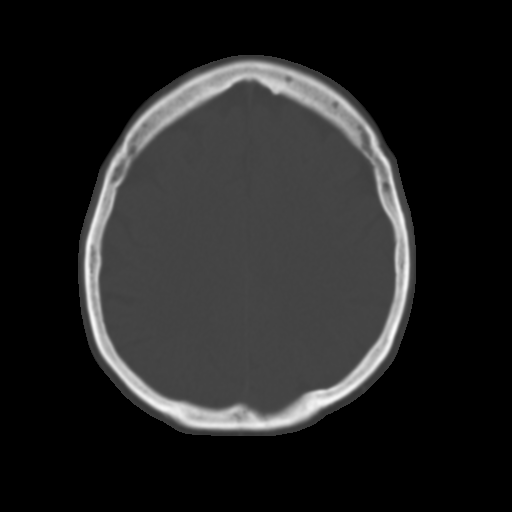
[im 22/29  brain]
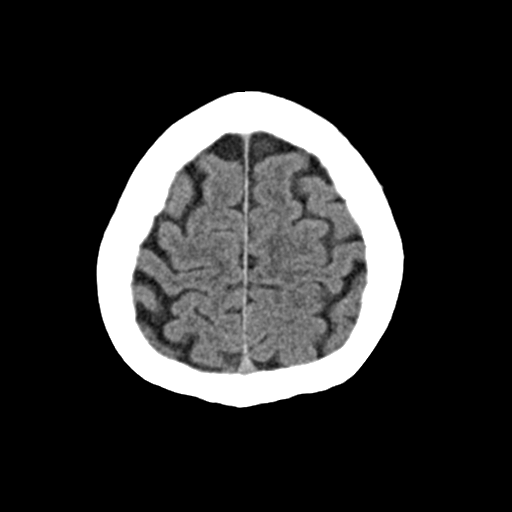
[im 25/29  brain]
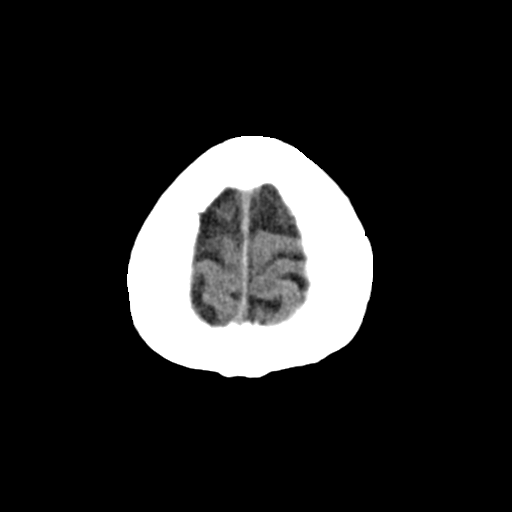

[Series 3: head bone · axial · 0.43mm/px · z∈[-433,-385]mm · 4 of 72 slices shown]
[im 8/72  bone]
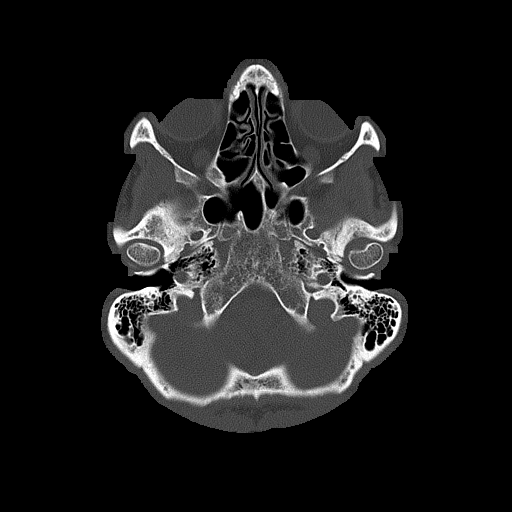
[im 15/72  bone]
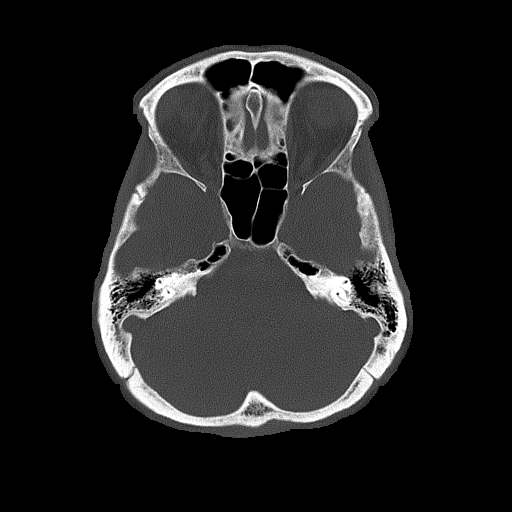
[im 22/72  bone]
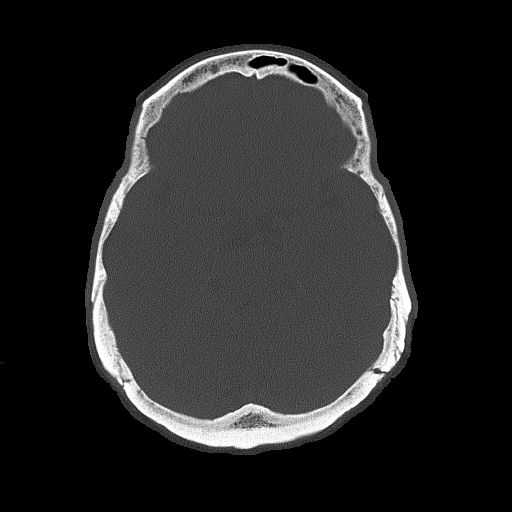
[im 32/72  bone]
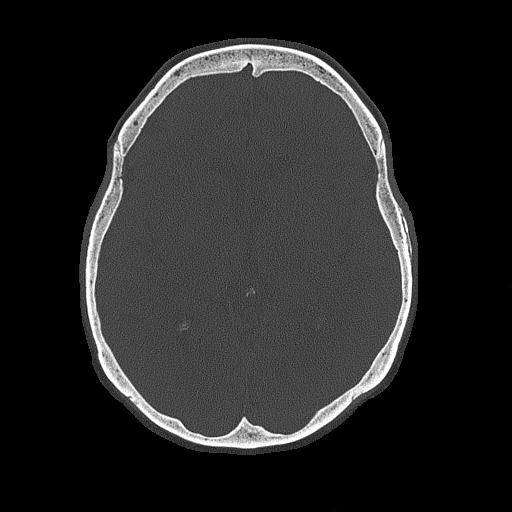

[Series 4: coronal soft · coronal · 0.31mm/px · 3 of 62 slices shown]
[im 21/62  brain]
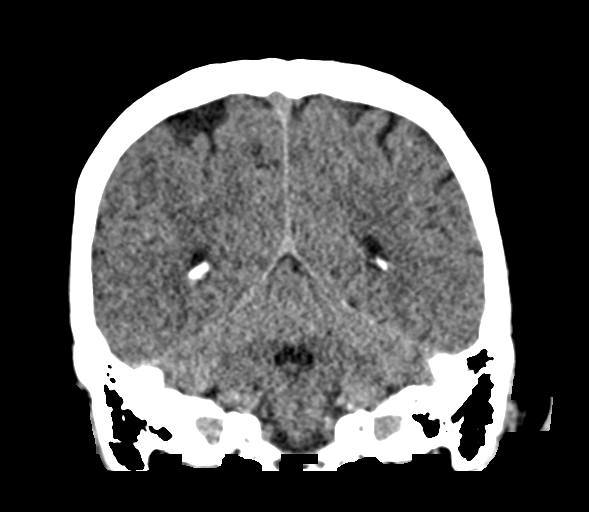
[im 28/62  brain]
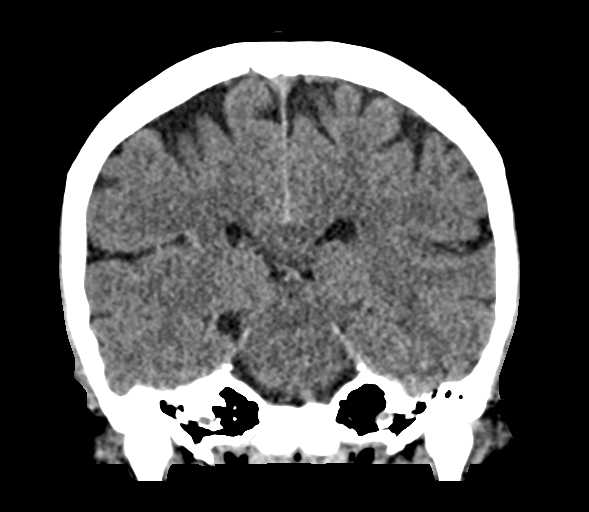
[im 34/62  brain]
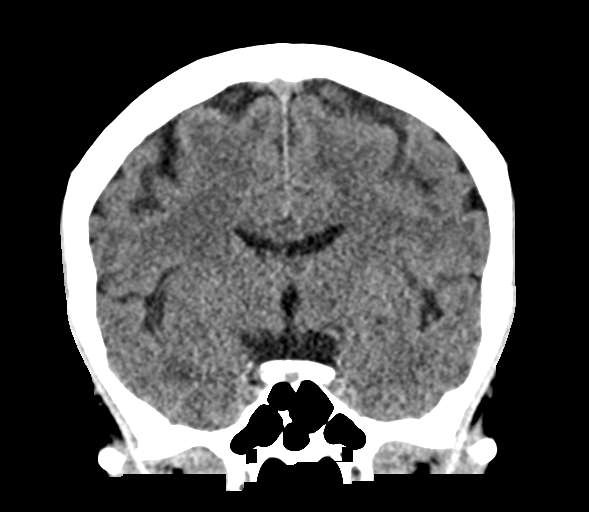

[Series 5: sag soft · sagittal · 0.31mm/px · 3 of 60 slices shown]
[im 20/60  brain]
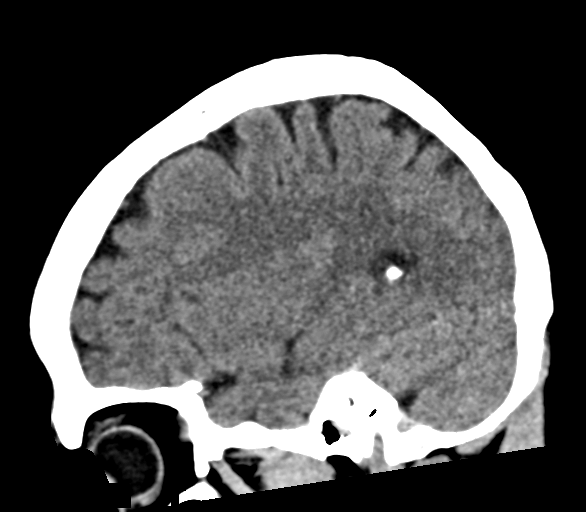
[im 30/60  brain]
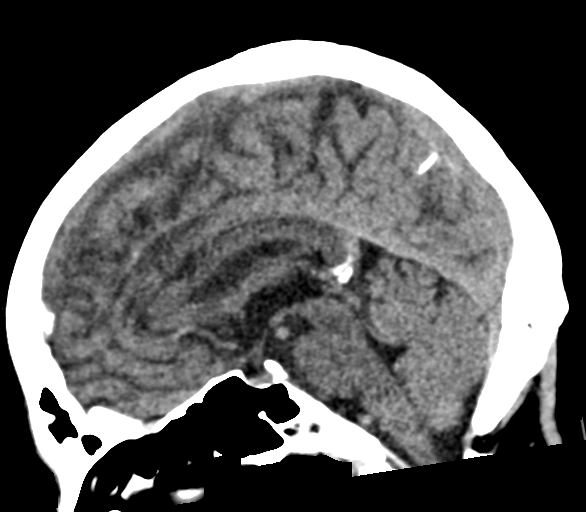
[im 40/60  brain]
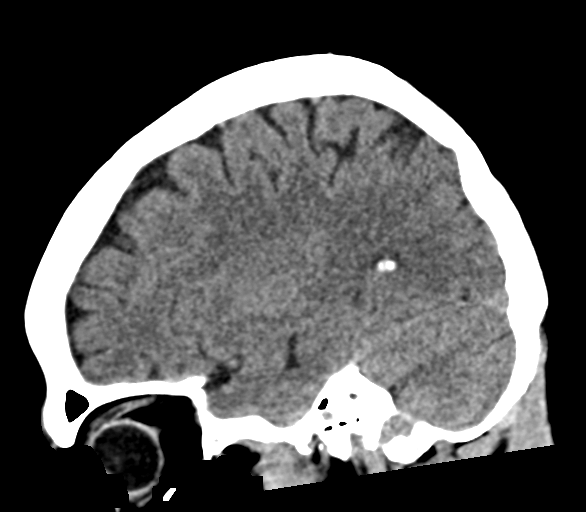

[17 of 47 positions shown; findings below may reference images not displayed]

FINDINGS: Brain: No evidence of acute infarction, hemorrhage, hydrocephalus,
extra-axial collection or mass lesion/mass effect.

Vascular: No hyperdense vessel or unexpected calcification.

Skull: Normal. Negative for fracture or focal lesion.

Sinuses/Orbits: No acute finding.

Other: None.
IMPRESSION: Unremarkable noncontrast head CT.

## 2023-05-16 IMAGING — DX DG CHEST 1V PORT
1 series · 1 of 1 positions shown · non-contrast
Comparison: Portable exam 8698 hours compared to 06/25/2021

CLINICAL DATA: Shortness of breath, headache, cough, sore throat,
LEFT ear pain, productive cough, COPD, with around kids with RSV 1
week ago

EXAM:
PORTABLE CHEST 1 VIEW

[chest ap]
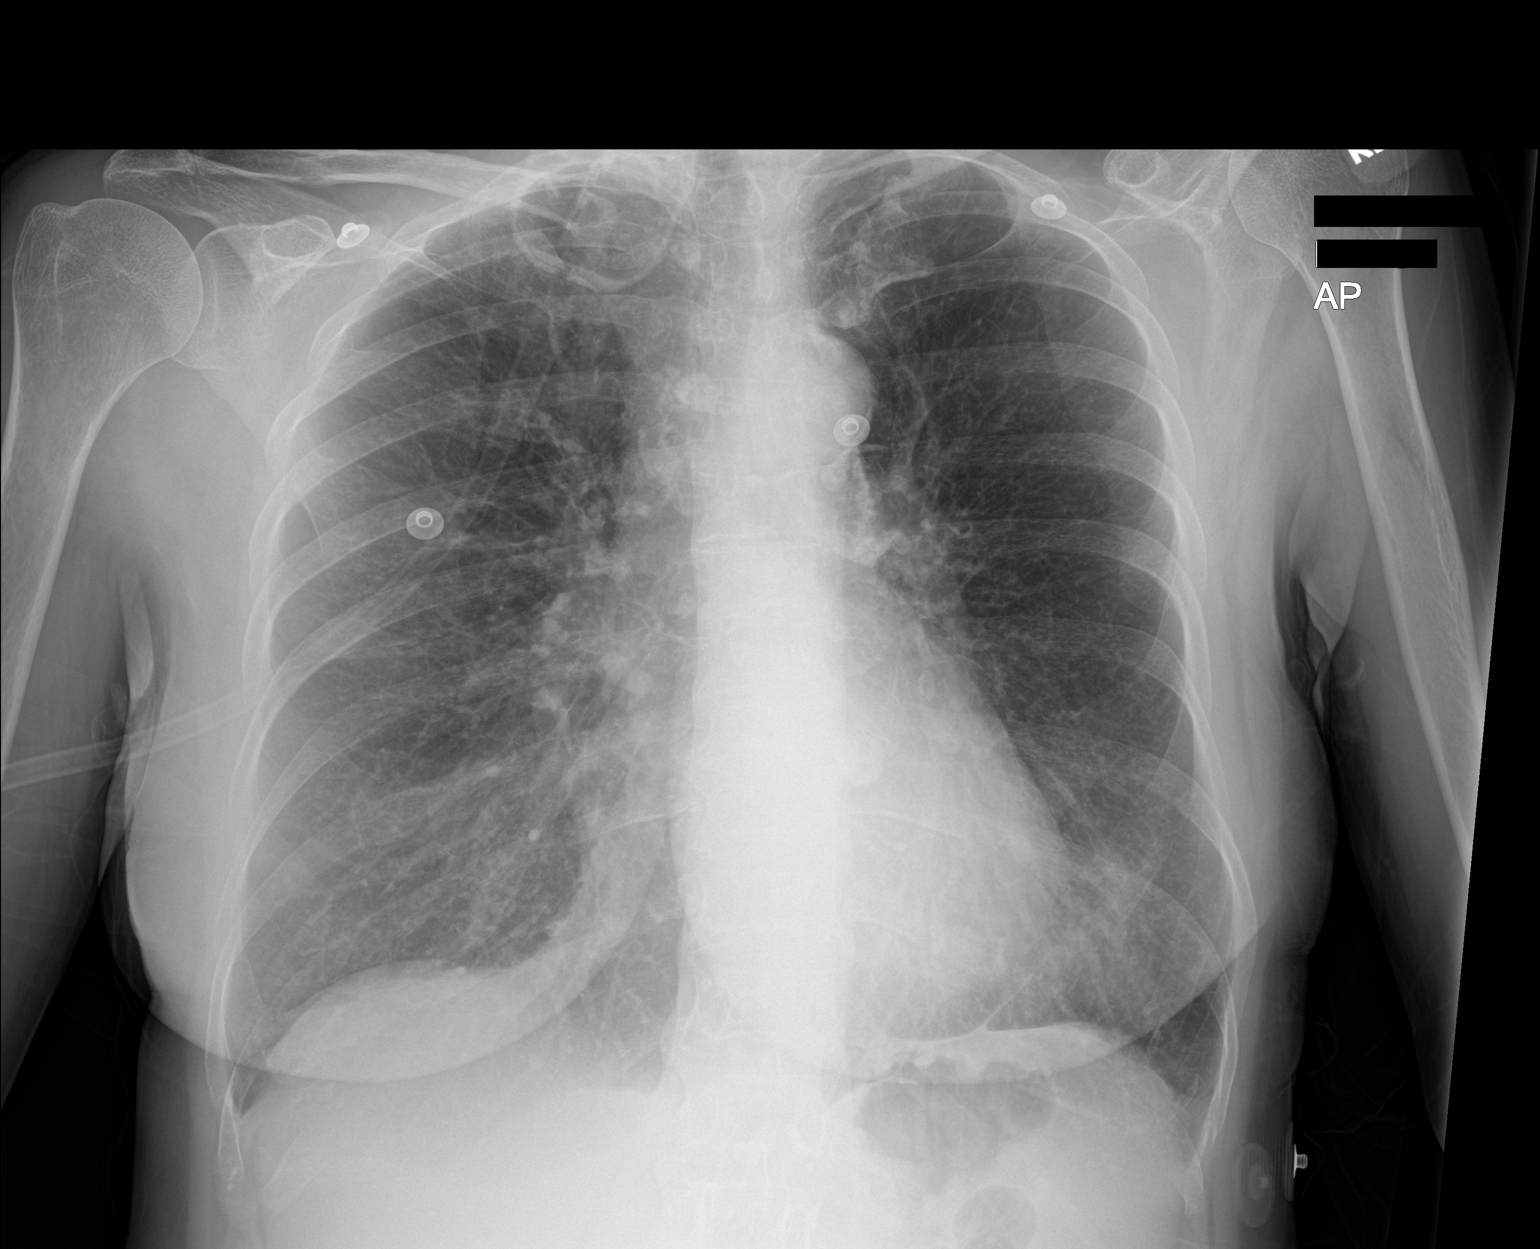

[1 of 1 positions shown; findings below may reference images not displayed]

FINDINGS: Normal heart size, mediastinal contours, and pulmonary vascularity.

Atherosclerotic calcification aorta.

Calcified mediastinal and hilar lymph nodes.

Chronic bronchitic changes without pulmonary infiltrate, pleural
effusion, or pneumothorax.

Bones demineralized.
IMPRESSION: Changes of chronic bronchitis and old granulomatous disease.

No acute abnormalities.

Aortic Atherosclerosis (1JMP7-PL0.0).

## 2024-09-11 ENCOUNTER — Encounter: Payer: Self-pay | Admitting: Obstetrics & Gynecology

## 2024-09-15 ENCOUNTER — Ambulatory Visit (HOSPITAL_BASED_OUTPATIENT_CLINIC_OR_DEPARTMENT_OTHER): Admitting: Pulmonary Disease
